# Patient Record
Sex: Female | Born: 1980 | Race: White | Hispanic: No | Marital: Married | Smoking: Never smoker
Health system: Southern US, Community
[De-identification: ages and names within clinical notes are randomized; demographics above are authoritative.]

## PROBLEM LIST (undated history)

## (undated) DIAGNOSIS — K5010 Crohn's disease of large intestine without complications: Secondary | ICD-10-CM

## (undated) HISTORY — PX: COLON SURGERY: SHX602

## (undated) HISTORY — PX: KNEE ARTHROCENTESIS: SUR44

---

## 1999-07-04 ENCOUNTER — Other Ambulatory Visit: Admission: RE | Admit: 1999-07-04 | Discharge: 1999-07-04 | Payer: Self-pay | Admitting: Obstetrics and Gynecology

## 1999-10-16 ENCOUNTER — Other Ambulatory Visit: Admission: RE | Admit: 1999-10-16 | Discharge: 1999-10-16 | Payer: Self-pay | Admitting: Obstetrics and Gynecology

## 2000-07-16 ENCOUNTER — Other Ambulatory Visit: Admission: RE | Admit: 2000-07-16 | Discharge: 2000-07-16 | Payer: Self-pay | Admitting: Obstetrics and Gynecology

## 2001-06-02 ENCOUNTER — Other Ambulatory Visit: Admission: RE | Admit: 2001-06-02 | Discharge: 2001-06-02 | Payer: Self-pay | Admitting: Obstetrics and Gynecology

## 2005-03-26 ENCOUNTER — Emergency Department: Payer: Self-pay | Admitting: Emergency Medicine

## 2006-01-06 ENCOUNTER — Ambulatory Visit: Payer: Self-pay | Admitting: Internal Medicine

## 2006-06-05 ENCOUNTER — Ambulatory Visit: Payer: Self-pay | Admitting: Internal Medicine

## 2006-08-26 ENCOUNTER — Ambulatory Visit: Payer: Self-pay | Admitting: Internal Medicine

## 2006-08-27 ENCOUNTER — Ambulatory Visit: Payer: Self-pay | Admitting: Internal Medicine

## 2006-08-27 ENCOUNTER — Inpatient Hospital Stay (HOSPITAL_COMMUNITY): Admission: EM | Admit: 2006-08-27 | Discharge: 2006-08-29 | Payer: Self-pay | Admitting: Emergency Medicine

## 2006-09-12 ENCOUNTER — Emergency Department (HOSPITAL_COMMUNITY): Admission: EM | Admit: 2006-09-12 | Discharge: 2006-09-12 | Payer: Self-pay | Admitting: Emergency Medicine

## 2006-10-09 ENCOUNTER — Ambulatory Visit: Payer: Self-pay | Admitting: Internal Medicine

## 2008-01-22 IMAGING — CR DG CHEST 2V
2 series · 2 of 2 positions shown · non-contrast
Comparison: 09/12/06.

CLINICAL DATA: 26-year-old, dyspnea.  
 CHEST - 2 VIEW:

[view not recorded (1 of 2)]
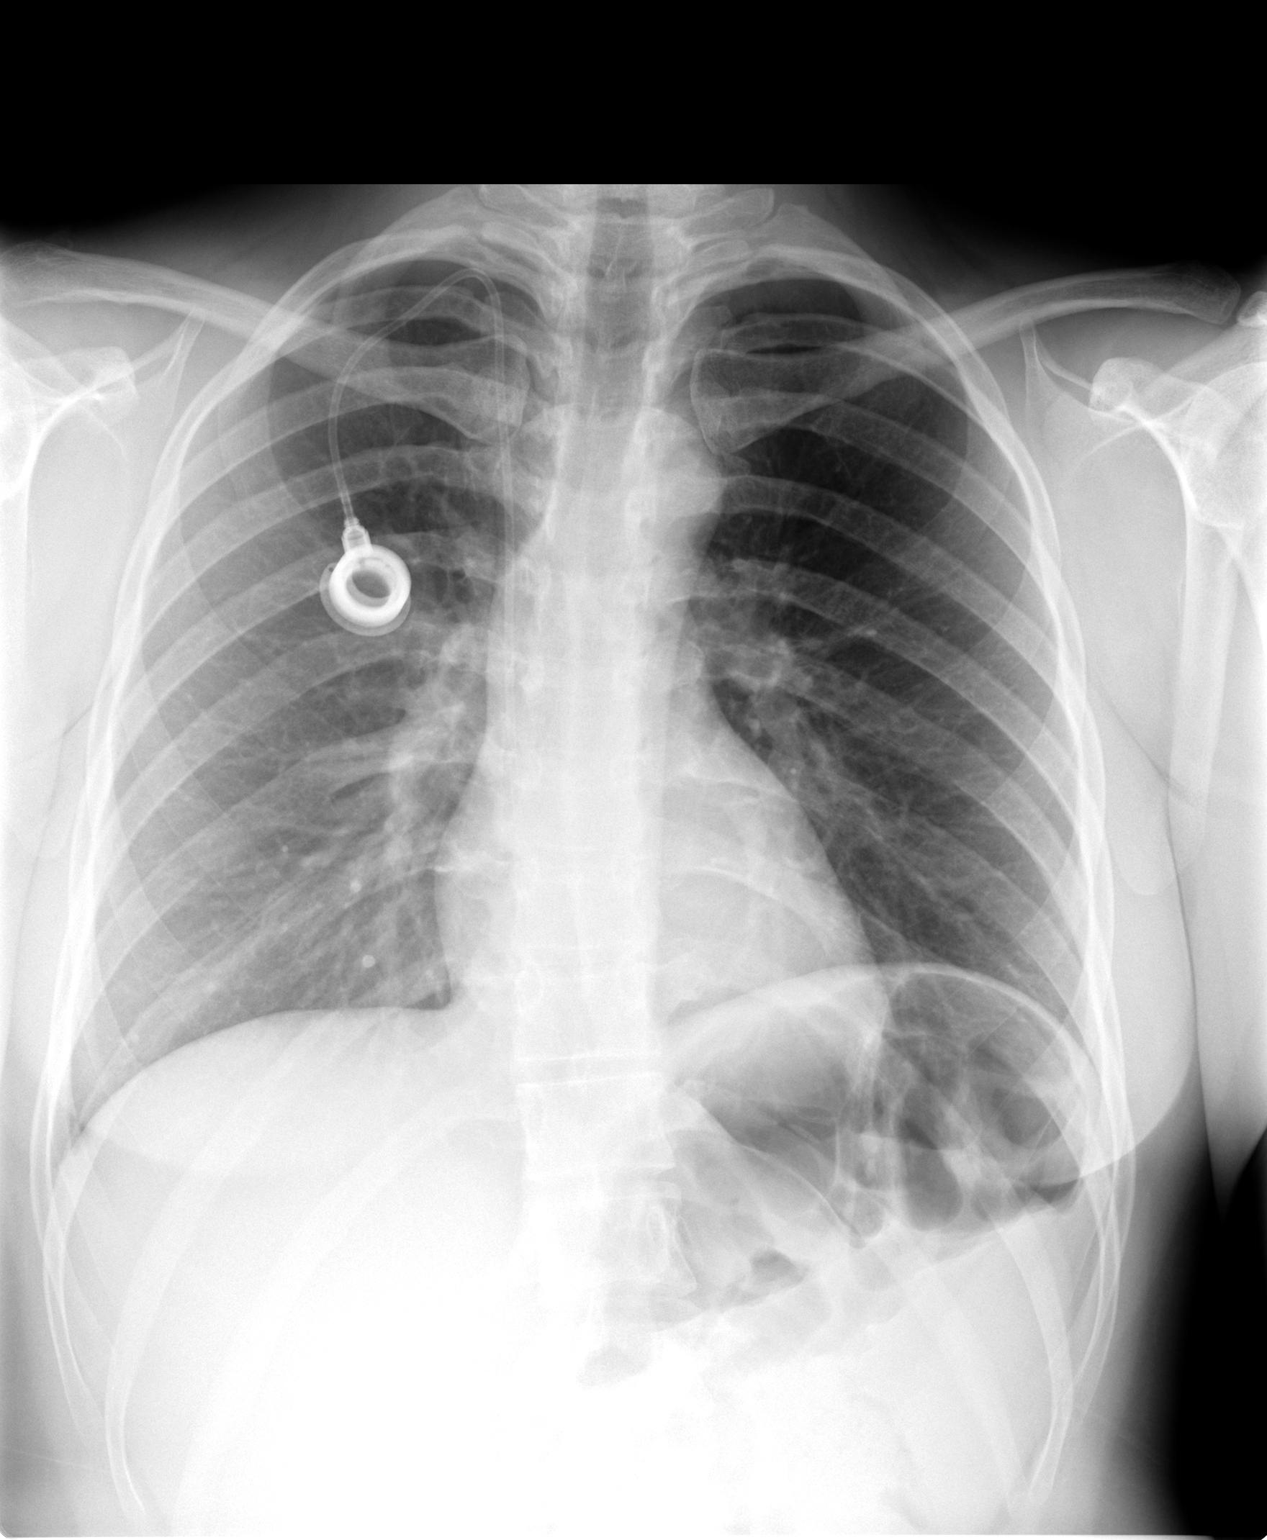

[view not recorded (2 of 2)]
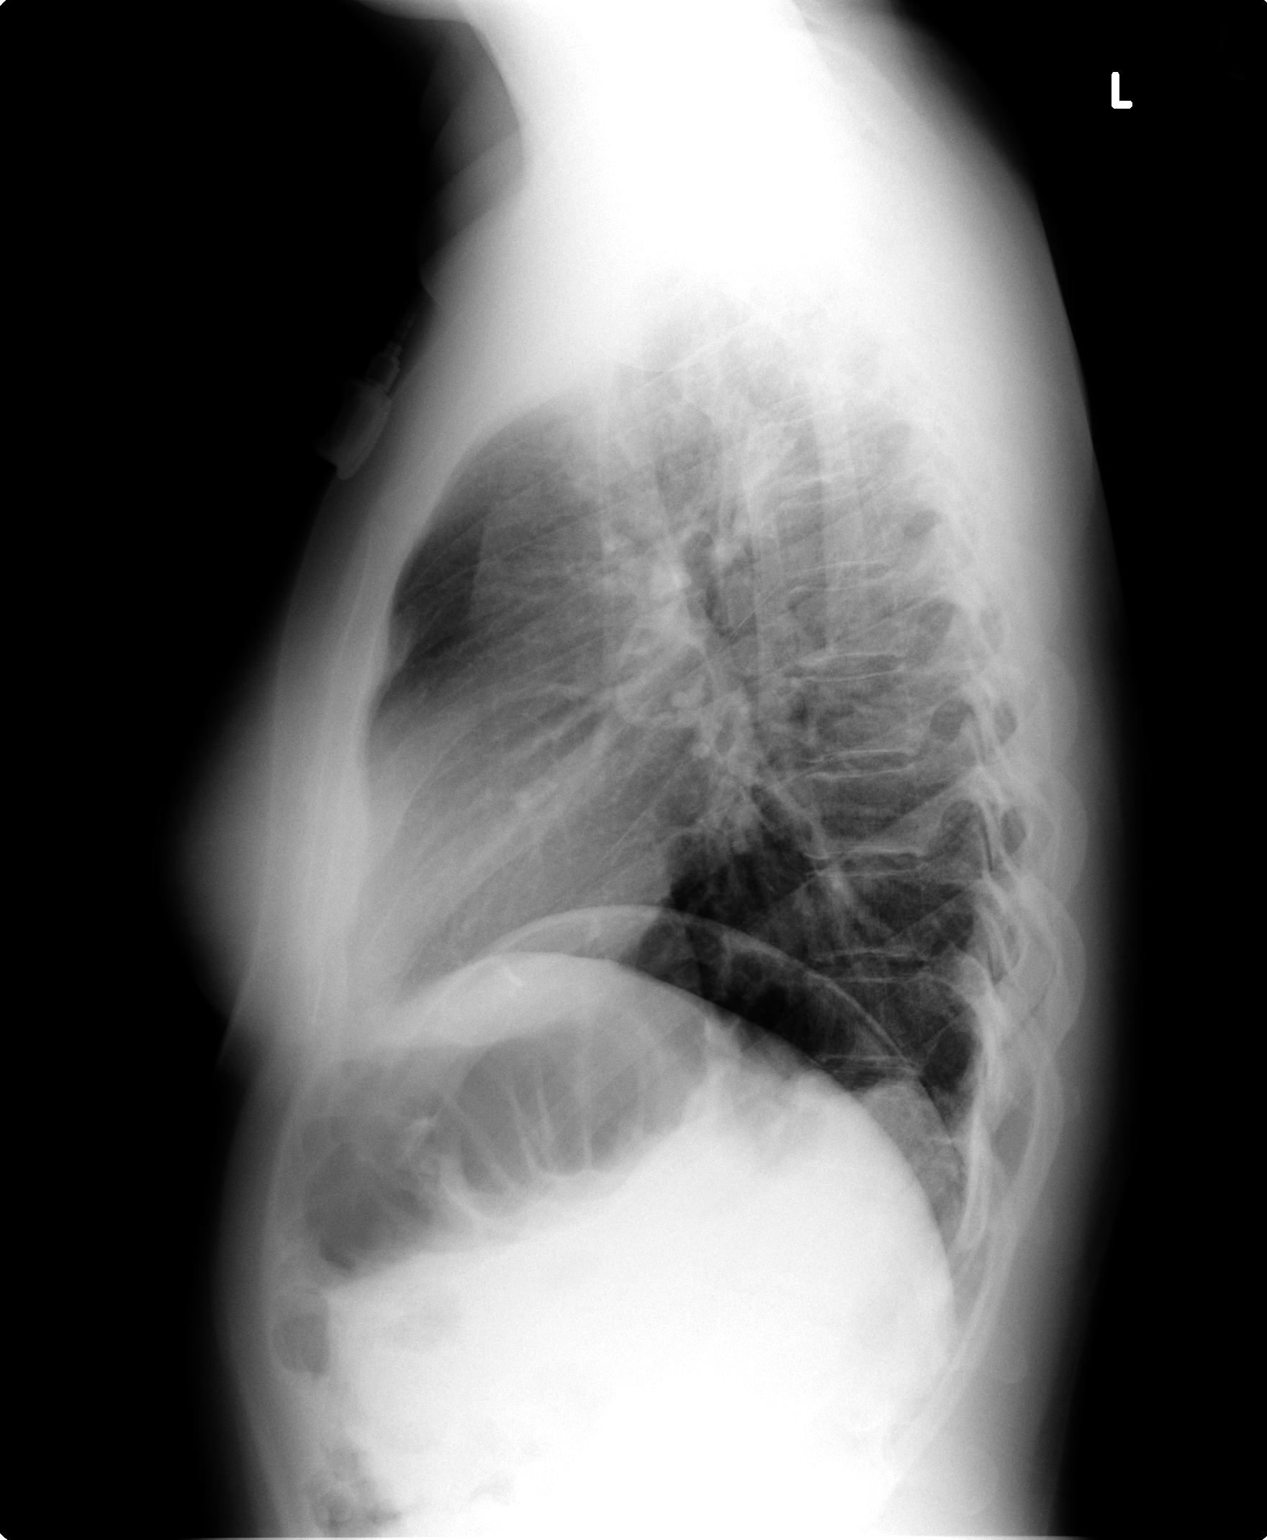

[2 of 2 positions shown; findings below may reference images not displayed]

Findings;  The port-a-cath is stable.  Cardiac silhouette, mediastinal, and hilar contours are within normal limits.  Lungs are clear of acute process.  Bony structures are intact.
IMPRESSION: No acute cardiopulmonary findings.

## 2008-07-10 ENCOUNTER — Ambulatory Visit: Payer: Self-pay | Admitting: Family Medicine

## 2008-10-11 ENCOUNTER — Ambulatory Visit: Payer: Self-pay | Admitting: Occupational Medicine

## 2008-10-11 DIAGNOSIS — M65849 Other synovitis and tenosynovitis, unspecified hand: Secondary | ICD-10-CM

## 2008-10-11 DIAGNOSIS — M65839 Other synovitis and tenosynovitis, unspecified forearm: Secondary | ICD-10-CM | POA: Insufficient documentation

## 2010-06-05 NOTE — Discharge Summary (Signed)
Sandra Hanson, Sandra Hanson                ACCOUNT NO.:  [id number]   MEDICAL RECORD NO.:  [id number]          PATIENT TYPE:  INP   LOCATION:  1529                         FACILITY:  [facility]   PHYSICIAN:  Casimiro Needle B. Sherene Sires, MD, FCCPDATE OF BIRTH:  29-Apr-1978   DATE OF ADMISSION:  08/26/2006  DATE OF DISCHARGE:  08/29/2006                         DISCHARGE SUMMARY - REFERRING   DISCHARGE DIAGNOSES:  1. Vocal cord dysfunction, upper airway irritation following presumed      aspiration.  2. Chronic pain.  3. Crohn's disease.  4. Open perirectal wound.   HISTORY OF PRESENT ILLNESS:  Sandra Hanson is a 30 year old white  female who presented to the emergency department at [facility]  in acute respiratory distress.  She has noted that she had aspirated an  almond the prior day while talking on the phone.  She had no cough prior  to this but had incessant upper airway barking quality cough that  continued to plague her.  In the emergency department, she was found to  be anxious to the point of almost being hysterical.  For that reason,  she was admitted to [facility] for further evaluation and  treatment.   LABORATORY DATA:  Sodium 140, potassium 4.2, chloride 106, CO2 27,  glucose 102, BUN 14, creatinine 0.81.  Calcium 9.6.  Protein 7.6.  AST  27, ALT 25.  Alkaline phosphatase 77, total bilirubin 0.5.  The wbc is  7.8, hemoglobin is 14.7, hematocrit 41.7, platelets 253.   Chest x-ray shows stable support apparatus, no changes.  No acute  distress.  She has a right IJ Port-A-Cath in place.   PROCEDURE:  On August 27, 2006, she underwent a fiberoptic bronchoscopy  per Dr. Sandrea Hughs.  After being appropriate sedated with a total of  10 mg Versed and 100 mg of IV Demerol, her airway was inspected x2.  Did  not demonstrate any foreign bodies.  Additional lidocaine was used to  anesthetize her entire bronchotracheal tree.  The mid trachea was  erythematous and slightly  friable mucosa.  The distal trachea was normal  as was the carina and all airways and subsegmental levels were inspected  twice for any evidence of abnormality and was found to be clear.  No  further interventions were required.   HOSPITAL COURSE BY DISCHARGE DIAGNOSIS:  DISCHARGE DIAGNOSIS #1 -  ASPIRATION OF SUSPECTED FOREIGN BODY WITH FOCAL CORD DYSFUNCTION, UPPER  AIRWAY IRRITATION:  After being admitted to the hospital, she  subsequently was transferred to the intensive care unit for continuous  barking cough and hysteria.  After being evaluated in the intensive care  unit, she was taken to bronchoscopy suite and underwent a fiberoptic  bronchoscopy with Dr. Sherene Sires with negative findings.  We intensified her  care, transferred her to the floor.  She reached maximum hospital  benefit by August 29, 2006, and was ready for discharge home.  Note, we  will continue her on her proton pump inhibitor for a total of 30 days to  decrease irritation of acid reflux and already irritated airway.  She  will  follow up with Dr. Sandrea Hughs in one week for further evaluation  and treatment.   DISCHARGE DIAGNOSIS #2 -  CHRONIC PAIN:  She was continued on her  current medications to consist of Fentanyl patch and other medications  as noted in her discharge medications.  This has been an ongoing problem  with her.   PROBLEM #3 -  CROHN'S DISEASE:  This was a moot point at this time.   PROBLEM #4 -  OPEN WOUND FROM PERIRECTAL AREA WHERE SHE HAS HAD  MULTIPLE, I.E., NINE OPERATIONS FOR PERIRECTAL ABSCESS:  She will  continue under the care of [place] surgeons for  this.   DISCHARGE DIAGNOSIS #5 -  ANXIETY:  This is addressed and continuation  of her Xanax.   DISCHARGE MEDICATIONS:  1. Cymbalta 60 mg daily.  2. Fentanyl 50 mcg patch change q.72h.  3. Flector 1.3% patch to affected area, change b.i.d.  4. Humira 40 mcg subcu every two weeks.  5. Oxycodone 1 tablet three  times a day as needed.  6. Phenergan 25 mg one tablet q.6h. as needed.  7. Requip 0.5 mg three tablets at bedtime.  8. Xanax 1 mg one tablet q.8h. as needed.  9. Zofran 8 mg q.12h. as needed.  10.Flexeril 10 mg q.6h. as needed.  11.Robitussin DM 1 teaspoon q.12h. as needed for cough.  12.Protonix 40 mg one tablet daily until gone.  13.Prednisone on a taper 40 mg for three days, 30 mg for three days,      20 mg for three days, 10 mg for three days and then stop.   FOLLOW UP APPOINTMENTS:  Dr. Sandrea Hughs on September 10, 2006, at 11:45  a.m.   DIET:  Heart healthy diet.   She has been instructed should she have any acute respiratory distress,  to seek attention at the nearest emergency room or to follow up with [place]  if it is less stringent.      Devra Dopp, MSN, ACNP      Charlaine Dalton. Sherene Sires, MD, [facility]  Electronically Signed    SM/MEDQ  D:  08/29/2006  T:  08/29/2006  Job:  [id number]   cc:   Bethann Punches  Fax: [phone]

## 2010-06-05 NOTE — Op Note (Signed)
Sandra Hanson, Sandra Hanson                ACCOUNT NO.:  [id number]   MEDICAL RECORD NO.:  [id number]          PATIENT TYPE:  INP   LOCATION:  1224                         FACILITY:  [facility]   PHYSICIAN:  Charlaine Dalton. Sherene Sires, MD, FCCPDATE OF BIRTH:  10/21/1980   DATE OF PROCEDURE:  08/27/2006  DATE OF DISCHARGE:                               OPERATIVE REPORT   PROCEDURE:  Fiberoptic bronchoscopy, diagnostic.   HISTORY:  This is a 26-year white female who feels she aspirated an  almond yesterday while talking on the phone.  She had no cough prior to  this but has been having incessant an upper airway barking quality  cough and is convinced that there is something stuck in her throat.  The cough has not been controllable with inhaled lidocaine, albuterol,  and analgesics to date.  The cough was so severe last night, she was  moved to the ICU to manage possible intubation.   The patient was extremely anxious prior to the procedure but did agree  to the procedure after discussion of full discussion of risks, benefits,  and alternatives, and received a total of 10 mg of IV Versed and 100 mg  of IV Demerol.   She was premedicated with 1% lidocaine.   Using a standard flexible fiberoptic bronchoscope, the right naris was  nares was easily cannulated, with good visualization of the entire  oropharynx and larynx.  The cords moved normally, and there were no  apparent upper airway lesions.   Using additional 1% lidocaine, the entire tracheobronchial tree was  explored, with the following findings.  The mid-trachea was  erythematous, with slightly friable mucosa.  The distal trachea was  normal, as was the carina, and all the airways to the subsegmental level  were inspected twice for any evidence of abnormality including a foreign  body.  All the airways opened widely, and there were no mucosal  abnormalities beyond the mid-trachea.   IMPRESSION:  Inflamed mid-trachea mucosa secondary to incessant  coughing.  I believe that although she very well may have aspirated an  almond at one point, it has been coughed up, and at this point the cough  is actually part of the problem and that she is traumatizing the trachea  because she is coughing so violently.   I have explained this to her mother, an R.N., in detail and recommended  the use of the flutter valve as well as around-the-clock  bronchodilators.  Will also use Solu-Medrol to try to control  inflammation.   Impression is incessant coughing, without any evidence of foreign body.   RECOMMENDATIONS:  Return to the step-down unit for medical therapy.      Charlaine Dalton. Sherene Sires, MD, [facility]  Electronically Signed     MBW/MEDQ  D:  08/27/2006  T:  08/27/2006  Job:  [id number]

## 2010-06-05 NOTE — Assessment & Plan Note (Signed)
[facility]                             PULMONARY OFFICE NOTE   DEARIA, WILMOUTH                       MRN:          [id number]  DATE:10/09/2006                            DOB:          1980-02-01    CHIEF COMPLAINT:  Cough.   HISTORY:  This is an exceptionally challenging and complicated 30-year-  old white female whom I evaluated for acute onset cough after possibly  choking on an almond one day prior to admission on August 5. This lead  eventually to a bronchoscopy that showed nothing but irritation of the  trachea proximally, with all the peripheral airways perfectly normal.   I presumed she had a cyclic cough and noted that she did have a history  of Crohn's disease and chronic pain syndrome and contemplated that  whether or not she had a functional aspect of the cough and/or airway  involvement with Crohn's, although I thought the latter very unlikely.   She was supposed to come back for follow up within 2 weeks but never  returned to the clinic. Her husband tells me today that she was  completely better for 2 weeks but now has been getting her care from  multiple centers, including within [facility] both in the  emergency room and by the ENT Department there (she identified Dr.  Deland Pretty). She says that different doctors even within the same  medical center have been disagreeing over what her problem is. One  doctor told her she had vocal cord dysfunction, one doctor said she had  pneumonia but she could not identify who told her what.   She comes back having apparently tapered herself off of all narcotics  (note she had previous chronic pain syndrome) and also stopping her  antidepressants (she had previously been on Cymbalta). (I could not  actually tell under who's instructions she had been tapering or  adjusting her medications). In any case she and her husband are both  extremely frustrated with her care and with her  course and concerned  that no one has addressed the cough to their satisfaction (although they  both acknowledge that she was 100% better for 2 weeks after we last saw  her in August).   Presently she reports she is taking Protonix twice daily but did not  initially tell me that she was taking it the way that we had recommended  (30 to 60 minutes before meals twice daily). She is also taking ,  Requip, B12, Humira injections, and multivitamins.   She states she can not take prednisone for long periods of time because  she has glaucoma (although she is not under the care of an eye doctor or  on any glaucoma medicines).   ALLERGIES:  INCLUDE SULFA, REGLAN, MORPHINE AND DROPERIDOL.   She told me the best medicine that seemed to help her cough was the use  of Haldol but says she can not stay on that long term.   PHYSICAL EXAMINATION:  She is an ambulatory white female with somewhat a  belle indifference affect and attitude in that  her husband seems more  concerned than she is about her present problems. She is afebrile,  stable vital signs.  HEENT: Unremarkable. Oropharynx clear.  NECK: Supple without cervical adenopathy or tenderness. Trachea is  midline. No thyromegaly.  LUNG FIELDS: Perfectly clear bilaterally to auscultation and percussion  with no cough listened on inspiratory or expiratory maneuvers.  There is a regular rate and rhythm without murmur, rub, or gallop.  ABDOMEN: Soft, benign.  EXTREMITIES: Warm without calf tenderness, cyanosis, clubbing, or edema.   Chest x-ray is normal   IMPRESSION:  Recurrent upper airway cough that previously caused  inflammation in the proximal trachea only and occurred after she thought  she had choked an almond. She has been diagnosed with vocal cord  dysfunction at [facility] by one doctor but not by the other indicating  that she probably has intermittent vocal cord dysfunction as well as  functional reflux (she coughs so violently I  am sure she is refluxing as  well as causing secondary inflammation and trauma to the upper airway.   I am not really sure I can help this patient especially as frustrated as  she and her husband appear to be and I do not have a definitive  diagnosis but do not believe she has a pulmonary problem.   I did recommend the following specifics:  1. First I reviewed the optimal use of Protonix with her in writing      and also advised her to avoid all mint and menthol lozenges instead      use sugarless candy or ice to suppress the urge to clear her      throat.  2. Prednisone 10 mg tablets, #14, to be taken over 6 days.  3. I strongly encouraged the use of flutter valve and explained why it      was necessary for her not to cough violently (because it      traumatizes the airway when she does).  4. For cough use Mucinex DM 2 every 12 hours and if breakthrough cough      use Haldol in combination with tramadol every 4 hours just to      control the cyclical nature of the cough. If this is not effective      I had told her I would consider admitting her to the hospital.   The patient seemed frustrated that I did not have time to hear about all  the workup she had been through and the opinions of all the different  doctors that she has seen. I explained to the patient and her husband  that I would be happy to review records if made available to me, but  that how she actually feels is more important to me than what the  various doctors told her since she can only give me secondhand what  their impression was, and then until I can verify the objective findings  there is  no point in her trying to relate everything she has been  through.   However, when I reviewed the record I found out that she had actually  missed her follow up with me in August and I suspect we will have the  same problem here, namely that she will not return on a regular enough  basis to develop rapport and communication  to be able to control this  problem. I am very concerned about the anxiety and depression component  of the problem especially since she tapered Cymbalta off (without  being  able to tell me actually who was helping her taper if off). I will  therefore defer further workup to Dr. Bethann Punches in [place]. He  certainly has pulmonary and ENT physicians available there that can  coordinate her care there along with her gastroenterologist.   Add: I called the patient after the visit to let her know there is no  Deland Pretty ENT at [facility] and to ask her where she received her GI  care. It turns out it's in [facility], making a total of 4 diffent  medical systems trying to help her. I informed her this was not only  likely to be extremely fragmented, it also is likely to lead to more  frustration on her and her husband's part and since she's happy with her  GI care it would make more sense to let he be seen by [facility]NT  and Pulmonary as well if Dr Hyacinth Meeker agrees.     Charlaine Dalton. Sherene Sires, MD, [facility]  Electronically Signed    MBW/MedQ  DD: 10/09/2006  DT: 10/09/2006  Job #: [id number]

## 2010-11-02 LAB — BASIC METABOLIC PANEL
CO2: 26
Calcium: 8.9
Chloride: 105
GFR calc Af Amer: >60
Glucose, Bld: 91
Potassium: 4.1
Sodium: 139

## 2010-11-02 LAB — URINALYSIS, ROUTINE W REFLEX MICROSCOPIC
Glucose, UA: NEGATIVE
Hgb urine dipstick: NEGATIVE
Ketones, ur: NEGATIVE
Protein, ur: NEGATIVE
Urobilinogen, UA: 0.2

## 2010-11-02 LAB — DIFFERENTIAL
Basophils Absolute: 0
Basophils Relative: 0
Eosinophils Relative: 1
Monocytes Absolute: 0.4
Monocytes Relative: 5
Neutro Abs: 6.3

## 2010-11-02 LAB — HEPATIC FUNCTION PANEL
ALT: 64 — ABNORMAL HIGH
AST: 36
Bilirubin, Direct: 0.1
Indirect Bilirubin: 0.6
Total Bilirubin: 0.7

## 2010-11-02 LAB — CBC
HCT: 39.3
Hemoglobin: 13.6
MCHC: 34.6
RBC: 4.32
RDW: 13.8

## 2010-11-05 LAB — COMPREHENSIVE METABOLIC PANEL
Albumin: 4.1
BUN: 14
Calcium: 9.6
Creatinine, Ser: 0.81
GFR calc Af Amer: >60
Total Protein: 7.6

## 2010-11-05 LAB — DIFFERENTIAL
Basophils Absolute: 0
Lymphocytes Relative: 19
Lymphs Abs: 1.5
Monocytes Absolute: 0.4
Monocytes Relative: 5
Neutro Abs: 5.8

## 2010-11-05 LAB — CBC
HCT: 41.7
MCV: 88.4
Platelets: 253
RDW: 12.4

## 2010-12-20 DIAGNOSIS — I341 Nonrheumatic mitral (valve) prolapse: Secondary | ICD-10-CM | POA: Insufficient documentation

## 2011-06-20 DIAGNOSIS — K5090 Crohn's disease, unspecified, without complications: Secondary | ICD-10-CM | POA: Insufficient documentation

## 2013-06-03 ENCOUNTER — Ambulatory Visit: Payer: Self-pay | Admitting: Internal Medicine

## 2014-12-20 DIAGNOSIS — E538 Deficiency of other specified B group vitamins: Secondary | ICD-10-CM | POA: Insufficient documentation

## 2018-10-12 ENCOUNTER — Encounter: Payer: Self-pay | Admitting: Emergency Medicine

## 2018-10-12 ENCOUNTER — Other Ambulatory Visit: Payer: Self-pay

## 2018-10-12 ENCOUNTER — Emergency Department (INDEPENDENT_AMBULATORY_CARE_PROVIDER_SITE_OTHER): Payer: 59

## 2018-10-12 ENCOUNTER — Emergency Department (INDEPENDENT_AMBULATORY_CARE_PROVIDER_SITE_OTHER)
Admission: EM | Admit: 2018-10-12 | Discharge: 2018-10-12 | Disposition: A | Discharge status: Home / Self Care | Payer: 59 | Source: Home / Self Care | Attending: Family Medicine | Admitting: Family Medicine

## 2018-10-12 DIAGNOSIS — M62838 Other muscle spasm: Secondary | ICD-10-CM

## 2018-10-12 DIAGNOSIS — M542 Cervicalgia: Secondary | ICD-10-CM

## 2018-10-12 HISTORY — DX: Crohn's disease of large intestine without complications: K50.10

## 2018-10-12 MED ORDER — KETOROLAC TROMETHAMINE 60 MG/2ML IM SOLN
60.0000 mg | Freq: Once | INTRAMUSCULAR | Status: AC
  Administered 2018-10-12: 60 mg via INTRAMUSCULAR

## 2018-10-12 MED ORDER — PREDNISONE 20 MG PO TABS: ORAL_TABLET | ORAL | 0 refills | Status: DC

## 2018-10-12 MED ORDER — CYCLOBENZAPRINE HCL 10 MG PO TABS: 10.0000 mg | ORAL_TABLET | Freq: Three times a day (TID) | ORAL | 0 refills | Status: DC | PRN

## 2018-10-12 MED ORDER — METHYLPREDNISOLONE SODIUM SUCC 125 MG IJ SOLR
80.0000 mg | Freq: Once | INTRAMUSCULAR | Status: AC
  Administered 2018-10-12: 80 mg via INTRAMUSCULAR

## 2018-10-12 MED ORDER — CERVICAL COLLAR ADJUSTABLE MISC: 0 refills | Status: DC

## 2018-10-12 NOTE — ED Provider Notes (Signed)
[facility]    CSN: [id number] Arrival date & time: 10/12/18  1511      History   Chief Complaint Chief Complaint  Patient presents with  . Neck Pain    HPI Sandra Hanson is a 38 y.o. female.   Three days ago patient awoke with aching in her left neck.  The area has become more painful, extending almost to her left shoulder.  The pain is worse with any neck movement.  She recalls no injury to her neck.  The history is provided by the patient.    Past Medical History:  Diagnosis Date  . Crohn's colitis [facility])     Patient Active Problem List   Diagnosis Date Noted  . TENDINITIS, WRIST 10/11/2008         Home Medications    Prior to Admission medications   Medication Sig Start Date End Date Taking? Authorizing Provider  Adalimumab (HUMIRA) 40 MG/0.4ML PSKT Inject into the skin.   Yes [provider]  cyclobenzaprine (FLEXERIL) 10 MG tablet Take 1 tablet (10 mg total) by mouth 3 (three) times daily as needed for muscle spasms. 10/12/18   Lattie Haw, MD  Elastic Bandages & Supports (CERVICAL COLLAR ADJUSTABLE) MISC Wear daily as directed 10/12/18   Lattie Haw, MD  predniSONE (DELTASONE) 20 MG tablet Take one tab by mouth twice daily for 4 days, then one daily for 3 days. Take with food. 10/12/18   Lattie Haw, MD    Family History Family History  Problem Relation Age of Onset  . Hypertension Mother   . Migraines Mother   . Fibromyalgia Mother   . Wilson's disease Father   . Cancer Father     Social History Social History   Tobacco Use  . Smoking status: Never Smoker  . Smokeless tobacco: Never Used  Substance Use Topics  . Alcohol use: Yes  . Drug use: Not Currently     Allergies   Droperidol, Metoclopramide hcl, Morphine, and Sulfonamide derivatives   Review of Systems Review of Systems  Constitutional: Negative.   HENT: Negative.   Eyes: Negative.   Respiratory: Negative.   Cardiovascular: Negative.    Gastrointestinal: Negative.   Genitourinary: Negative.   Musculoskeletal: Positive for neck pain and neck stiffness.  Skin: Negative.   Neurological: Negative for numbness and headaches.     Physical Exam Triage Vital Signs ED Triage Vitals  Enc Vitals Group     BP 10/12/18 1636 139/90     Pulse Rate 10/12/18 1636 85     Resp --      Temp 10/12/18 1636 98.7 F (37.1 C)     Temp Source 10/12/18 1636 Oral     SpO2 10/12/18 1636 97 %     Weight 10/12/18 1638 185 lb (83.9 kg)     Height 10/12/18 1638 5\' 5"  (1.651 m)     Head Circumference --      Peak Flow --      Pain Score 10/12/18 1637 7     Pain Loc --      Pain Edu? --      Excl. in GC? --    No data found.  Updated Vital Signs BP 139/90 (BP Location: Right Arm)   Pulse 85   Temp 98.7 F (37.1 C) (Oral)   Ht 5\' 5"  (1.651 m)   Wt 83.9 kg   SpO2 97%   BMI 30.79 kg/m   Visual Acuity Right Eye Distance:  Left Eye Distance:   Bilateral Distance:    Right Eye Near:   Left Eye Near:    Bilateral Near:     Physical Exam Vitals signs and nursing note reviewed.  Constitutional:      General: She is not in acute distress. HENT:     Head: Atraumatic.     Right Ear: Tympanic membrane normal.     Left Ear: Tympanic membrane normal.     Nose: Nose normal.     Mouth/Throat:     Pharynx: Oropharynx is clear.  Eyes:     Conjunctiva/sclera: Conjunctivae normal.     Pupils: Pupils are equal, round, and reactive to light.  Neck:     Musculoskeletal: Neck supple. Decreased range of motion. Pain with movement and muscular tenderness present. No edema, erythema, torticollis or spinous process tenderness.      Comments: Patient has distinct tenderness to palpation over her left and posterior neck as noted on diagram.  She has difficulty rotating and flexing her neck to the left.  Distal neurovascular function is intact.  Cardiovascular:     Rate and Rhythm: Normal rate.     Heart sounds: Normal heart sounds.   Pulmonary:     Breath sounds: Normal breath sounds.  Musculoskeletal:     Right lower leg: No edema.     Left lower leg: No edema.  Lymphadenopathy:     Cervical: No cervical adenopathy.  Skin:    General: Skin is warm and dry.     Findings: No rash.  Neurological:     Mental Status: She is alert.      UC Treatments / Results  Labs (all labs ordered are listed, but only abnormal results are displayed) Labs Reviewed - No data to display  EKG   Radiology Dg Cervical Spine Complete  Result Date: 10/12/2018 CLINICAL DATA:  Left-sided neck pain for 3 days.  No known injury. EXAM: CERVICAL SPINE - COMPLETE 4+ VIEW COMPARISON:  None. FINDINGS: Straightening of the cervical lordosis, which may be positional or secondary to muscular strain. Intervertebral disc spaces are preserved. Vertebral body heights are maintained. No fractures. Oblique views reveal patent bony neural foramina. Dens and lateral masses are aligned. There is mild facet and uncovertebral arthropathy within the mid cervical spine. IMPRESSION: 1. No acute fracture or static subluxation, cervical spine. 2. Mild facet and uncovertebral degenerative change. 3. Straightening of the cervical lordosis, which may be positional or secondary to muscular strain. Electronically Signed   By: Duanne Guess M.D.   On: 10/12/2018 18:08    Procedures Procedures (including critical care time)  Medications Ordered in UC Medications  methylPREDNISolone sodium succinate (SOLU-MEDROL) 125 mg/2 mL injection 80 mg (has no administration in time range)  ketorolac (TORADOL) injection 60 mg (60 mg Intramuscular Given 10/12/18 1746)    Initial Impression / Assessment and Plan / UC Course  I have reviewed the triage vital signs and the nursing notes.  Pertinent labs & imaging results that were available during my care of the patient were reviewed by me and considered in my medical decision making (see chart for details).    C spine X-ray:  note mild facet degenerative changes Administered Toradol 60mg  IM. Administered Solumedrol 80mg  IM, then begin prednisone burst/taper. Rx for Flexeril. Rx for cervical collar. Followup with Dr. Rodney Langton or Dr. Clementeen Graham ([facility]) if not improving about one week.    Final Clinical Impressions(s) / UC Diagnoses   Final diagnoses:  Muscle  spasms of neck     Discharge Instructions     Begin prednisone Tuesday 10/13/18. Apply ice pack for 20 to 30 minutes, 3 to 4 times daily  Continue until pain and swelling decrease.  Wear cervical collar. Begin range of motion and stretching exercises as tolerated.    ED Prescriptions    Medication Sig Dispense Auth. Provider   predniSONE (DELTASONE) 20 MG tablet Take one tab by mouth twice daily for 4 days, then one daily for 3 days. Take with food. 11 tablet Kandra Nicolas, MD   cyclobenzaprine (FLEXERIL) 10 MG tablet Take 1 tablet (10 mg total) by mouth 3 (three) times daily as needed for muscle spasms. 20 tablet Kandra Nicolas, MD   Elastic Bandages & Supports (CERVICAL COLLAR ADJUSTABLE) MISC Wear daily as directed 1 each Assunta Found Ishmael Holter, MD        Kandra Nicolas, MD 10/14/18 [phone]

## 2018-10-12 NOTE — Discharge Instructions (Addendum)
Begin prednisone Tuesday 10/13/18. Apply ice pack for 20 to 30 minutes, 3 to 4 times daily  Continue until pain and swelling decrease.  Wear cervical collar. Begin range of motion and stretching exercises as tolerated.

## 2018-10-12 NOTE — ED Triage Notes (Signed)
Neck pain x 3 days, Left side, decreased ROM, heat helps, 800mg s Ibuprofen

## 2019-02-10 ENCOUNTER — Encounter: Payer: Self-pay | Admitting: Sports Medicine

## 2019-02-10 ENCOUNTER — Other Ambulatory Visit: Payer: Self-pay

## 2019-02-10 ENCOUNTER — Ambulatory Visit (INDEPENDENT_AMBULATORY_CARE_PROVIDER_SITE_OTHER): Payer: 59 | Admitting: Sports Medicine

## 2019-02-10 DIAGNOSIS — M542 Cervicalgia: Secondary | ICD-10-CM

## 2019-02-10 DIAGNOSIS — G8929 Other chronic pain: Secondary | ICD-10-CM | POA: Diagnosis not present

## 2019-02-10 DIAGNOSIS — M5030 Other cervical disc degeneration, unspecified cervical region: Secondary | ICD-10-CM | POA: Insufficient documentation

## 2019-02-10 MED ORDER — DIAZEPAM 5 MG PO TABS: ORAL_TABLET | ORAL | 0 refills | Status: DC

## 2019-02-10 MED ORDER — PREDNISONE 10 MG (48) PO TBPK: ORAL_TABLET | Freq: Every day | ORAL | 0 refills | Status: DC

## 2019-02-10 MED ORDER — KETOROLAC TROMETHAMINE 60 MG/2ML IM SOLN
60.0000 mg | Freq: Once | INTRAMUSCULAR | Status: AC
  Administered 2019-02-10: 60 mg via INTRAMUSCULAR

## 2019-02-10 NOTE — Progress Notes (Signed)
    Procedures performed today:    None.  Independent interpretation of tests performed by another provider:   I personally reviewed her cervical spine x-rays, they simply show straightening of the normal lordosis consistent with spasm.  Impression and Recommendations:    Chronic neck pain Chronic right-sided neck pain with spasm. This has been present now for over 4 months on and off. This is in spite of several visits to urgent care and conservative measures. Toradol 60 mg intramuscular. At this point we are going to do a prolonged prednisone taper for 12 days, adding hydrocodone for pain, Valium as a muscle relaxer as Robaxin and Flexeril did not help much. X-ray was unrevealing so we are going to proceed with an MRI as well as aggressive formal physical therapy. Return to see me in 2 weeks.    ___________________________________________ Ihor Austin. Benjamin Stain, M.D., ABFM., CAQSM. Primary Care and Sports Medicine [facility]  Adjunct Instructor of Family Medicine  [employer]

## 2019-02-10 NOTE — Assessment & Plan Note (Signed)
Chronic right-sided neck pain with spasm. This has been present now for over 4 months on and off. This is in spite of several visits to urgent care and conservative measures. Toradol 60 mg intramuscular. At this point we are going to do a prolonged prednisone taper for 12 days, adding hydrocodone for pain, Valium as a muscle relaxer as Robaxin and Flexeril did not help much. X-ray was unrevealing so we are going to proceed with an MRI as well as aggressive formal physical therapy. Return to see me in 2 weeks.

## 2019-02-15 ENCOUNTER — Other Ambulatory Visit: Payer: Self-pay

## 2019-02-15 ENCOUNTER — Ambulatory Visit (INDEPENDENT_AMBULATORY_CARE_PROVIDER_SITE_OTHER): Payer: 59 | Admitting: Rehabilitative and Restorative Service Providers"

## 2019-02-15 DIAGNOSIS — R293 Abnormal posture: Secondary | ICD-10-CM | POA: Diagnosis not present

## 2019-02-15 DIAGNOSIS — M62838 Other muscle spasm: Secondary | ICD-10-CM | POA: Diagnosis not present

## 2019-02-15 DIAGNOSIS — M542 Cervicalgia: Secondary | ICD-10-CM | POA: Diagnosis not present

## 2019-02-15 NOTE — Patient Instructions (Signed)
Access Code: [email or web address]  URL: [email or web address]  Date: 02/15/2019  Prepared by: Margretta Ditty   Exercises Towel Roll Stretch - 10 reps - 1 sets - 2x daily - 7x weekly Supine Cervical Rotation AROM on Pillow - 10 reps - 1 sets - 2x daily - 7x weekly

## 2019-02-15 NOTE — Therapy (Signed)
[facility] [place] [address] [place], [place], [address] Phone: [phone]   Fax:  [phone]  Physical Therapy Evaluation  Patient Details  Name: Sandra Hanson MRN: [id number] Date of Birth: Dec 02, 1980 Referring Provider (PT): Monica Becton, MD   Encounter Date: 02/15/2019  PT End of Session - 02/15/19 1441    Visit Number  1    Number of Visits  12    Date for PT Re-Evaluation  04/01/19    PT Start Time  1350    PT Stop Time  1435    PT Time Calculation (min)  45 min       Past Medical History:  Diagnosis Date  . Crohn's colitis (HCC)       There were no vitals filed for this visit.   Subjective Assessment - 02/15/19 1355    Subjective  The patient awoke with a "kink" in her neck on 10/11/18 with worsening pain t/o the day.  She went to urgent care on 9/21 and received injection for pain.  Symptoms resolved after a couple of weeks.  She awoke 2 weeks ago and had another kink in her neck and her neck flared back up.  She did a phone visit through work and took steroids, robaxin, motrin and began with icy hot.  Symtpoms improved on the steroids but pain worsened when she tapered off.  She saw Dr. Karie Schwalbe and received another week of tordol and steroids.  She feels she is moving better today than she has in the past 2 weeks.    Patient Stated Goals  reduce pain; prevent these occurrences of neck pain    Currently in Pain?  Yes    Pain Score  6    moving her head increases pain significantly   Pain Location  Neck    Pain Orientation  Right    Pain Descriptors / Indicators  Stabbing;Spasm;Discomfort    Pain Type  Acute pain    Pain Radiating Towards  R side    Pain Onset  1 to 4 weeks ago    Pain Frequency  Constant    Aggravating Factors   varies in intensity    Pain Relieving Factors  lying down improves it         [facility] PT Assessment - 02/15/19 1404      Assessment   Medical Diagnosis  neck pain     Referring Provider (PT)  Monica Becton, MD    Onset Date/Surgical Date  --   2 weeks ago   Hand Dominance  Right      Precautions   Precautions  None      Restrictions   Weight Bearing Restrictions  No      Balance Screen   Has the patient fallen in the past 6 months  No    Has the patient had a decrease in activity level because of a fear of falling?   No    Is the patient reluctant to leave their home because of a fear of falling?   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     AROM Assessment Site  Cervical    Cervical Flexion  10    Cervical Extension  20    Cervical - Right Side Bend  10    Cervical - Left Side Bend  30    Cervical - Right Rotation  30    Cervical - Left Rotation  65      Strength   Overall Strength Comments  R UE/L UE; has 5/5 MMT for shoulder flexion and abduction, although any resistance leads to R sided neck pain      Palpation   Palpation comment  tender along C3, C4 facet joints, paraspinal musculature.  Joint mobiization limited for PA mobility mid to lower cervical spine, lateral glides diminished mobility R>L with muscle guarding      Special Tests    Special Tests  Cervical    Cervical Tests  Dictraction      Distraction Test   Findngs  Positive    side  Right    Comment  mild reduction in symptoms                Objective measurements completed on  examination: See above findings.      [place] Adult PT Treatment/Exercise - 02/15/19 1404      Exercises   Exercises  Neck      Neck Exercises: Supine   Cervical Isometrics  Right lateral flexion;Right rotation;3 secs    Cervical Isometrics Limitations  performed contract/relax R lateral flexion + rotation    Neck Retraction  5 reps;3 secs    Neck Retraction Limitations  within tolerable ROM    Cervical Rotation  Right;Left;10 reps    Cervical Rotation Limitations  within tolerable ROM *more limited to R      Modalities   Modalities  Cryotherapy      Cryotherapy   Number Minutes Cryotherapy  6 Minutes    Cryotherapy Location  Cervical    Type of Cryotherapy  Ice massage      Manual Therapy   Manual Therapy  Joint mobilization;Soft tissue mobilization;Taping    Manual therapy comments  supine and sideling; taping to the R upper trap and levator to support cervical musculature    Joint Mobilization  SUPINE:  PA mobilization mid cervical spine grade I-II ; lateral glide grade II R>L within tolerance.  SIDELYING:  lateral glide R>L in L sidelying    Soft tissue mobilization  STM to paraspinal musculature             PT Education - 02/15/19 1440    Education Details  initiated HEP    Person(s) Educated  Patient    Methods  Explanation;Demonstration;Handout    Comprehension  Returned demonstration;Verbalized understanding          PT Long Term Goals - 02/15/19 1442      PT LONG TERM GOAL #1   Title  The patient will be able to return demo HEP for neck flexibility, postural strengthening and stability.    Time  6    Period  Weeks    Target Date  04/01/19      PT LONG  TERM GOAL #2   Title  The patient will report functional limitation < 30% per FOTO.    Baseline  66% limited    Time  6    Period  Weeks    Target Date  04/01/19      PT LONG TERM GOAL #3   Title  The patient will improve R cervical rotation to > or equal to 55 degrees.    Baseline  30 degrees.     Time  6    Period  Weeks    Target Date  04/01/19      PT LONG TERM GOAL #4   Title  The patient will improve R cervical sidebending to 25 degrees.    Baseline  10 degrees    Time  6    Period  Weeks    Target Date  04/01/19      PT LONG TERM GOAL #5   Title  The patient will report resting pain < or equal to 2/10 in R cervical spine.    Baseline  6/10    Time  6    Period  Weeks    Target Date  04/01/19             Plan - 02/15/19 1452    Clinical Impression Statement  The patient is a 39 year old female with second occurrence of cervical muscule spasms after a "kink" in her neck.  At this time, she presents with significant limitation in cervical ROM, muscle guarding, decreased joint mobility, and muscle spasm.  Patinet has pain limiting all ADLs/IADLs.  PT to address deficits to promote return to prior level of function and focus on strengthening postural muscles for prevention of ongoing neck issues.    Examination-Activity Limitations  Lift;Reach Overhead;Bed Mobility    Examination-Participation Restrictions  Driving    Stability/Clinical Decision Making  Stable/Uncomplicated    Clinical Decision Making  Low    Rehab Potential  Good    PT Frequency  2x / week    PT Duration  6 weeks    PT Treatment/Interventions  Patient/family education;Neuromuscular re-education;Therapeutic exercise;Therapeutic activities;Functional mobility training;ADLs/Self Care Home Management;Cryotherapy;Electrical Stimulation;Iontophoresis 4mg /ml Dexamethasone;Moist Heat;Traction;Ultrasound;Manual techniques;Taping;Dry needling;Joint Manipulations;Spinal Manipulations;Passive range of motion    PT Next Visit Plan  joint mobilization mid to upper cervical spine; thoracic spine assessment for PA mobility; HEP with thoracic extension, progress neck stabilization (prone on elbows or quadriped for anti-gravity strengthening).    PT Home Exercise Plan  Access Code:    Consulted and Agree with  Plan of Care  Patient       Patient will benefit from skilled therapeutic intervention in order to improve the following deficits and impairments:  Pain, Hypomobility, Impaired flexibility, Increased muscle spasms, Decreased range of motion, Decreased strength, Postural dysfunction  Visit Diagnosis: Cervicalgia  Other muscle spasm  Abnormal posture     Problem List Patient Active Problem List   Diagnosis Date Noted  . Chronic neck pain 02/10/2019  . TENDINITIS, WRIST 10/11/2008    Burnie Hank, PT 02/15/2019, 2:56 PM  [facility] [address] [place], [place], [place] Phone: [phone]   Fax:  [phone]  Name: KENNEDY BRINES MRN: Tamala Ser Date of Birth: 1981-01-16

## 2019-02-17 ENCOUNTER — Encounter: Payer: Self-pay | Admitting: Rehabilitative and Restorative Service Providers"

## 2019-02-17 ENCOUNTER — Ambulatory Visit (INDEPENDENT_AMBULATORY_CARE_PROVIDER_SITE_OTHER): Payer: 59 | Admitting: Rehabilitative and Restorative Service Providers"

## 2019-02-17 ENCOUNTER — Other Ambulatory Visit: Payer: Self-pay

## 2019-02-17 DIAGNOSIS — M542 Cervicalgia: Secondary | ICD-10-CM

## 2019-02-17 DIAGNOSIS — R293 Abnormal posture: Secondary | ICD-10-CM | POA: Diagnosis not present

## 2019-02-17 DIAGNOSIS — M62838 Other muscle spasm: Secondary | ICD-10-CM

## 2019-02-17 NOTE — Therapy (Signed)
[facility] [place] [address] [place], [place], [address] Phone: [phone]   Fax:  [phone]  Physical Therapy Treatment  Patient Details  Name: Sandra Hanson MRN: [id number] Date of Birth: May 07, 1980 Referring Provider (PT): Monica Becton, MD   Encounter Date: 02/17/2019  PT End of Session - 02/17/19 0930    Visit Number  2    Number of Visits  12    Date for PT Re-Evaluation  04/01/19    PT Start Time  0845    PT Stop Time  0937    PT Time Calculation (min)  52 min       Past Medical History:  Diagnosis Date  . Crohn's colitis [facility])     Past Surgical History:  Procedure Laterality Date  . COLON SURGERY    . KNEE ARTHROCENTESIS      There were no vitals filed for this visit.  Subjective Assessment - 02/17/19 0850    Subjective  The patient reports mild improvement in neck rotation.  She removed tape this morning and felt like it helped (got more support above shoulder blade and felt like she had more work below shoulder blade).    Patient Stated Goals  reduce pain; prevent these occurrences of neck pain    Currently in Pain?  Yes    Pain Score  5     Pain Location  Neck    Pain Orientation  Right    Pain Descriptors / Indicators  Spasm;Stabbing    Pain Type  Acute pain    Pain Radiating Towards  R side    Pain Onset  1 to 4 weeks ago    Pain Frequency  Constant    Aggravating Factors   movement    Pain Relieving Factors  lying down                       [facility] Adult PT Treatment/Exercise - 02/17/19 0852      Exercises   Exercises  Neck      Neck Exercises: Supine   Cervical Isometrics  Extension;Right lateral flexion;Left lateral flexion;Right rotation;Left rotation;5 secs    Cervical Isometrics Limitations  R side pain with relaxing,, L side pain during contracting    Neck Retraction  5 reps;3 secs    Capital Flexion  5 reps    Capital Flexion Limitations  attempted  segmental lifting of head off pillow, however increases pain R mid c-spine      Neck Exercises: Sidelying   Other Sidelying Exercise  capital extension with manual resistance reaching towards the floor (beginning position is in L sidelying reaching L arm to floor off top edge of mat, PT supporting head and having isometric axial extension x 3 second holds);  Performed 5 reps to R and L sides.    Other Sidelying Exercise  gravity assisted sidebending to tolerance R and L sides with PT supporting head      Neck Exercises: Prone   Axial Exension  10 reps    Axial Extension Limitations  added flexion/extension while elongating through posterior aspect of spine    Neck Retraction  10 reps    Other Prone Exercise  lateral rocking for shoulder and neck mobility      Modalities   Modalities  Electrical Stimulation;Moist Heat      Moist Heat Therapy   Number Minutes Moist Heat  12 Minutes    Moist Heat Location  Cervical  Acupuncturist Location  R upper trap, low cervical spine    Electrical Stimulation Action  premodulated    Electrical Stimulation Parameters  to tolerance    Electrical Stimulation Goals  Pain      Manual Therapy   Manual Therapy  Joint mobilization;Soft tissue mobilization;Manual Traction;Passive ROM;Neural Stretch    Manual therapy comments  Supine, prone, and sidelying    Joint Mobilization  SUPINE:  PA mid to low cervical spine grade II-III, lateral glide grade II-III R and L sides, cervical down glides R side grade II-III.  Sidelying lateral glides mid cervical spine R and L sides grade II.  PRONE:  thoracic PA mobs grade III-IV    Soft tissue mobilization  STM to paraspinal musculature in c-spine, scalenes.    Passive ROM  PROM c-spine within tolerance    Manual Traction  gentle manual tration 20 second holds x 5 reps    Neural Stretch  reaching to ground when performing sidelying cervical isometrics for neural glide              PT Education - 02/17/19 0929    Education Details  modified/progressed HEP    Person(s) Educated  Patient    Methods  Explanation;Demonstration;Handout    Comprehension  Verbalized understanding;Returned demonstration          PT Long Term Goals - 02/15/19 1442      PT LONG TERM GOAL #1   Title  The patient will be able to return demo HEP for neck flexibility, postural strengthening and stability.    Time  6    Period  Weeks    Target Date  04/01/19      PT LONG TERM GOAL #2   Title  The patient will report functional limitation < 30% per FOTO.    Baseline  66% limited    Time  6    Period  Weeks    Target Date  04/01/19      PT LONG TERM GOAL #3   Title  The patient will improve R cervical rotation to > or equal to 55 degrees.    Baseline  30 degrees.    Time  6    Period  Weeks    Target Date  04/01/19      PT LONG TERM GOAL #4   Title  The patient will improve R cervical sidebending to 25 degrees.    Baseline  10 degrees    Time  6    Period  Weeks    Target Date  04/01/19      PT LONG TERM GOAL #5   Title  The patient will report resting pain < or equal to 2/10 in R cervical spine.    Baseline  6/10    Time  6    Period  Weeks    Target Date  04/01/19            Plan - 02/17/19 0930    Clinical Impression Statement  The patient has slight improvement in ROM per subjective report.  She continues with ongoing R muscle spasm and pain.  PT added modalities at end of session for muscle relaxation after joint mobility and exercise.  PT modified HEP to add cervical isometrics and prone strengthening for neck stabilization.    Examination-Activity Limitations  Lift;Reach Overhead;Bed Mobility    Examination-Participation Restrictions  Driving    Stability/Clinical Decision Making  Stable/Uncomplicated    Rehab Potential  Good  PT Frequency  2x / week    PT Duration  6 weeks    PT Treatment/Interventions  Patient/family  education;Neuromuscular re-education;Therapeutic exercise;Therapeutic activities;Functional mobility training;ADLs/Self Care Home Management;Cryotherapy;Electrical Stimulation;Iontophoresis 4mg /ml Dexamethasone;Moist Heat;Traction;Ultrasound;Manual techniques;Taping;Dry needling;Joint Manipulations;Spinal Manipulations;Passive range of motion    PT Next Visit Plan  joint mobilization mid to upper cervical spine; thoracic spine PA mobility; HEP with thoracic extension, progress neck stabilization (prone on elbows or quadriped for anti-gravity strengthening).    PT Home Exercise Plan  Access Code:    Consulted and Agree with Plan of Care  Patient       Patient will benefit from skilled therapeutic intervention in order to improve the following deficits and impairments:  Pain, Hypomobility, Impaired flexibility, Increased muscle spasms, Decreased range of motion, Decreased strength, Postural dysfunction  Visit Diagnosis: Cervicalgia  Other muscle spasm  Abnormal posture     Problem List Patient Active Problem List   Diagnosis Date Noted  . Chronic neck pain 02/10/2019  . TENDINITIS, WRIST 10/11/2008    Perian Tedder , PT 02/17/2019, 9:32 AM  [facility] [address] [place], [place], [place] Phone: [phone]   Fax:  [phone]  Name: KINZLEE SELVY MRN: Tamala Ser Date of Birth: 24-Sep-1980

## 2019-02-17 NOTE — Patient Instructions (Signed)
Access Code: [email or web address]  URL: [email or web address]  Date: 02/17/2019  Prepared by: Margretta Ditty   Exercises Towel Roll Stretch - 10 reps - 1 sets - 2x daily - 7x weekly Supine Cervical Rotation AROM on Pillow - 10 reps - 1 sets - 2x daily - 7x weekly Cervical Extension Prone on Elbows - 10 reps - 1 sets - 2x daily - 7x weekly Supine Cervical Sidebending - Mid to End Range with Manual Resistance - 5 reps - 1 sets - 5 seconds hold - 2x daily - 7x weekly                  Supine Thoracic Mobilization Towel Roll Vertical with Arm Stretch - 1 reps - 1 sets - 2 minutes hold - 2x daily - 7x weekly

## 2019-02-21 ENCOUNTER — Other Ambulatory Visit: Payer: Self-pay

## 2019-02-21 ENCOUNTER — Ambulatory Visit (INDEPENDENT_AMBULATORY_CARE_PROVIDER_SITE_OTHER): Payer: 59

## 2019-02-21 DIAGNOSIS — M542 Cervicalgia: Secondary | ICD-10-CM | POA: Diagnosis not present

## 2019-02-21 DIAGNOSIS — G8929 Other chronic pain: Secondary | ICD-10-CM

## 2019-02-22 ENCOUNTER — Ambulatory Visit (INDEPENDENT_AMBULATORY_CARE_PROVIDER_SITE_OTHER): Payer: 59 | Admitting: Physical Therapy

## 2019-02-22 ENCOUNTER — Encounter: Payer: Self-pay | Admitting: Physical Therapy

## 2019-02-22 DIAGNOSIS — R293 Abnormal posture: Secondary | ICD-10-CM

## 2019-02-22 DIAGNOSIS — M62838 Other muscle spasm: Secondary | ICD-10-CM | POA: Diagnosis not present

## 2019-02-22 DIAGNOSIS — M542 Cervicalgia: Secondary | ICD-10-CM

## 2019-02-22 NOTE — Therapy (Signed)
[facility] [place], [place], [address] Phone: [phone]   Fax:  [phone]  Physical Therapy Treatment  Patient Details  Name: Sandra Hanson MRN: [id number] Date of Birth: 21-Oct-1980 Referring Provider (PT): Silverio Decamp, MD   Encounter Date: 02/22/2019  PT End of Session - 02/22/19 0935    Visit Number  3    Number of Visits  12    Date for PT Re-Evaluation  04/01/19    PT Start Time  0935    PT Stop Time  1019    PT Time Calculation (min)  44 min    Activity Tolerance  Patient tolerated treatment well    Behavior During Therapy  [facility] for tasks assessed/performed       Past Medical History:  Diagnosis Date  . Crohn's colitis [facility])     Past Surgical History:  Procedure Laterality Date  . COLON SURGERY    . KNEE ARTHROCENTESIS      There were no vitals filed for this visit.  Subjective Assessment - 02/22/19 0937    Subjective  Patient reports significant improvement since last visit.    Patient Stated Goals  reduce pain; prevent these occurrences of neck pain    Currently in Pain?  Yes    Pain Score  3     Pain Location  Neck    Pain Orientation  Right    Pain Descriptors / Indicators  Stabbing    Pain Type  Acute pain                       [facility] Adult PT Treatment/Exercise - 02/22/19 0001      Neck Exercises: Seated   Neck Retraction  5 reps    Neck Retraction Limitations  with rotation less comfortable than supiine    Other Seated Exercise  retraction x 5 5 sec      Neck Exercises: Supine   Cervical Isometrics  Right lateral flexion;Left lateral flexion;Right rotation;Left rotation;5 secs;5 reps    Neck Retraction  5 reps;3 secs    Capital Flexion  5 reps;5 secs    Capital Flexion Limitations  isometric    Cervical Rotation  Right;5 reps    Cervical Rotation Limitations  with chin tuck      Neck Exercises: Prone   Other Prone Exercise  prone on elbows  diagonals x 5 ea; improved seated extension after      Modalities   Modalities  Traction      Traction   Type of Traction  Cervical    Min (lbs)  3    Max (lbs)  8    Hold Time  60    Rest Time  20    Time  10   12 min total     Manual Therapy   Manual Therapy  Joint mobilization;Manual Traction    Joint Mobilization  supine PA; extension mobs C3/4, C4/5    Manual Traction  in sitting x 20 sec; then with right rotation x 5                  PT Long Term Goals - 02/15/19 1442      PT LONG TERM GOAL #1   Title  The patient will be able to return demo HEP for neck flexibility, postural strengthening and stability.    Time  6    Period  Weeks    Target Date  04/01/19      PT LONG TERM GOAL #2   Title  The patient will report functional limitation < 30% per FOTO.    Baseline  66% limited    Time  6    Period  Weeks    Target Date  04/01/19      PT LONG TERM GOAL #3   Title  The patient will improve R cervical rotation to > or equal to 55 degrees.    Baseline  30 degrees.    Time  6    Period  Weeks    Target Date  04/01/19      PT LONG TERM GOAL #4   Title  The patient will improve R cervical sidebending to 25 degrees.    Baseline  10 degrees    Time  6    Period  Weeks    Target Date  04/01/19      PT LONG TERM GOAL #5   Title  The patient will report resting pain < or equal to 2/10 in R cervical spine.    Baseline  6/10    Time  6    Period  Weeks    Target Date  04/01/19            Plan - 02/22/19 2046    Clinical Impression Statement  Patient presenting with decreased pain today. She is still limited with right rotation particularly in WB positions. She had some relief with supine neck retraction and rotation combined. She also did fairly well with distraction and rotation. She had increased cervical extension and quality of motion improved in sitting after performing prone on elbows diagonals in available ROM.  We did a trial of cervical  traction and she responded well.    PT Treatment/Interventions  Patient/family education;Neuromuscular re-education;Therapeutic exercise;Therapeutic activities;Functional mobility training;ADLs/Self Care Home Management;Cryotherapy;Electrical Stimulation;Iontophoresis 4mg /ml Dexamethasone;Moist Heat;Traction;Ultrasound;Manual techniques;Taping;Dry needling;Joint Manipulations;Spinal Manipulations;Passive range of motion    PT Next Visit Plan  joint mobilization mid to upper cervical spine; thoracic spine PA mobility; HEP with thoracic extension, progress neck stabilization (prone on elbows or quadriped for anti-gravity strengthening).       Patient will benefit from skilled therapeutic intervention in order to improve the following deficits and impairments:  Pain, Hypomobility, Impaired flexibility, Increased muscle spasms, Decreased range of motion, Decreased strength, Postural dysfunction  Visit Diagnosis: Cervicalgia  Other muscle spasm  Abnormal posture     Problem List Patient Active Problem List   Diagnosis Date Noted  . Chronic neck pain 02/10/2019  . TENDINITIS, WRIST 10/11/2008   10/13/2008 PT 02/22/2019, 8:51 PM  [facility] [address] [place], [place], [place] Phone: [phone]   Fax:  [phone]  Name: Sandra Hanson MRN: Tamala Ser Date of Birth: May 06, 1980

## 2019-02-24 ENCOUNTER — Encounter: Payer: Self-pay | Admitting: Physical Therapy

## 2019-02-24 ENCOUNTER — Other Ambulatory Visit: Payer: Self-pay

## 2019-02-24 ENCOUNTER — Ambulatory Visit (INDEPENDENT_AMBULATORY_CARE_PROVIDER_SITE_OTHER): Payer: 59 | Admitting: Sports Medicine

## 2019-02-24 ENCOUNTER — Ambulatory Visit (INDEPENDENT_AMBULATORY_CARE_PROVIDER_SITE_OTHER): Payer: 59 | Admitting: Physical Therapy

## 2019-02-24 DIAGNOSIS — M542 Cervicalgia: Secondary | ICD-10-CM | POA: Diagnosis not present

## 2019-02-24 DIAGNOSIS — M5030 Other cervical disc degeneration, unspecified cervical region: Secondary | ICD-10-CM | POA: Diagnosis not present

## 2019-02-24 DIAGNOSIS — M62838 Other muscle spasm: Secondary | ICD-10-CM | POA: Diagnosis not present

## 2019-02-24 DIAGNOSIS — R293 Abnormal posture: Secondary | ICD-10-CM

## 2019-02-24 MED ORDER — MELOXICAM 15 MG PO TABS: ORAL_TABLET | ORAL | 3 refills | Status: DC

## 2019-02-24 NOTE — Therapy (Signed)
[place], [place], [address] Phone: [phone]   Fax:  [phone]  Physical Therapy Treatment  Patient Details  Name: Sandra Hanson MRN: [id number] Date of Birth: 11/21/1980 Referring Provider (PT): Silverio Decamp, MD   Encounter Date: 02/24/2019  PT End of Session - 02/24/19 0929    Visit Number  4    Number of Visits  12    Date for PT Re-Evaluation  04/01/19    PT Start Time  0930    PT Stop Time  1018    PT Time Calculation (min)  48 min    Activity Tolerance  Patient tolerated treatment well    Behavior During Therapy  [facility] for tasks assessed/performed       Past Medical History:  Diagnosis Date  . Crohn's colitis [facility])     Past Surgical History:  Procedure Laterality Date  . COLON SURGERY    . KNEE ARTHROCENTESIS      There were no vitals filed for this visit.  Subjective Assessment - 02/24/19 0930    Subjective  Patient reports just some soreness with traction, but then yesterday had intermittent occurences of pain down her post left arm to elbow. Her ROM is better though.    Patient Stated Goals  reduce pain; prevent these occurrences of neck pain    Currently in Pain?  Yes    Pain Score  4     Pain Location  Neck    Pain Orientation  Right                       [facility] Adult PT Treatment/Exercise - 02/24/19 0001      Neck Exercises: Standing   Neck Retraction  10 reps;5 secs    Neck Retraction Limitations  against wall    Other Standing Exercises  Neuro re-ed using laser glasses: rotation, flex/ext and diagonals multiple reps      Neck Exercises: Prone   Neck Retraction  10 reps;5 secs    Neck Retraction Limitations  6 on elbows caused pinching in lower cervical; so went to prone    Other Prone Exercise  prone on elbows diagonals x 10 ea; Right rotation WNL afterwards      Manual Therapy   Manual Therapy  Joint mobilization;Soft tissue  mobilization    Joint Mobilization  prone PA mobs gd III C3-5    Soft tissue mobilization  to bil cerv paraspinals      Neck Exercises: Stretches   Upper Trapezius Stretch  Right;1 rep;30 seconds   with arm behind back and using strap for assist            PT Education - 02/24/19 1621    Education Details  progressed HEP    Person(s) Educated  Patient    Methods  Explanation;Demonstration;Handout    Comprehension  Verbalized understanding;Returned demonstration          PT Long Term Goals - 02/24/19 1625      PT LONG TERM GOAL #1   Title  The patient will be able to return demo HEP for neck flexibility, postural strengthening and stability.    Status  On-going      PT LONG TERM GOAL #3   Title  The patient will improve R cervical rotation to > or equal to 55 degrees.    Baseline  > 55 deg today after neuro re-ed    Status  Partially  Met      PT LONG TERM GOAL #4   Title  The patient will improve R cervical sidebending to 25 degrees.    Status  On-going      PT LONG TERM GOAL #5   Title  The patient will report resting pain < or equal to 2/10 in R cervical spine.    Status  On-going            Plan - 02/24/19 1621    Clinical Impression Statement  Patient reported intermittent pain into left upper arm since traction last visit so we held it today. She does have improved ROM in right rotation since last visit and this increased further today with neuroreed exercises. She did have increased tone in right cervical paraspinals, but this resolved with manual therapy. LTGs are progressing.    PT Frequency  2x / week    PT Duration  6 weeks    PT Treatment/Interventions  Patient/family education;Neuromuscular re-education;Therapeutic exercise;Therapeutic activities;Functional mobility training;ADLs/Self Care Home Management;Cryotherapy;Electrical Stimulation;Iontophoresis 13m/ml Dexamethasone;Moist Heat;Traction;Ultrasound;Manual techniques;Taping;Dry needling;Joint  Manipulations;Spinal Manipulations;Passive range of motion    PT Next Visit Plan  joint mobilization mid to upper cervical spine; thoracic spine PA mobility; HEP with thoracic extension, progress neck stabilization (prone on elbows or quadriped for anti-gravity strengthening).    PT Home Exercise Plan  Access Code: [email or web address]      Patient will benefit from skilled therapeutic intervention in order to improve the following deficits and impairments:  Pain, Hypomobility, Impaired flexibility, Increased muscle spasms, Decreased range of motion, Decreased strength, Postural dysfunction  Visit Diagnosis: Cervicalgia  Other muscle spasm  Abnormal posture     Problem List Patient Active Problem List   Diagnosis Date Noted  . DDD (degenerative disc disease), cervical 02/10/2019  . TENDINITIS, WRIST 10/11/2008    JMadelyn FlavorsPT 02/24/2019, 4:39 PM  [facility] [place] [address]Phone: [phone]  Fax:  [phone] Name: Sandra PERELLAMRN: [id number] of Birth: [birth year]

## 2019-02-24 NOTE — Progress Notes (Signed)
    Procedures performed today:    None.  Independent interpretation of tests performed by another provider:   None.  Impression and Recommendations:    DDD (degenerative disc disease), cervical Sandra Hanson returns, she is a pleasant 39 year old female with left-sided cervical radiculitis. She improved considerably with the aggressive treatment at the last visit but continues to have some discomfort, radiating down the left arm. MRI did show C3-C4 C4-C5 DDD with some compression of the right C5 nerve root, at the C4-C5 level by foraminal compression is possible. Improving considerably with PT but because she still has some discomfort we are going to add meloxicam. If failure of meloxicam after a couple weeks I will add gabapentin in a slow up taper. We will reserve epidurals as a last resort. I would like to see her back in a month and a virtual Doximity visit.    ___________________________________________ Ihor Austin. Benjamin Stain, M.D., ABFM., CAQSM. Primary Care and Sports Medicine [facility]  Adjunct Instructor of Family Medicine  [employer]

## 2019-02-24 NOTE — Assessment & Plan Note (Signed)
Sandra Hanson returns, she is a pleasant 39 year old female with left-sided cervical radiculitis. She improved considerably with the aggressive treatment at the last visit but continues to have some discomfort, radiating down the left arm. MRI did show C3-C4 C4-C5 DDD with some compression of the right C5 nerve root, at the C4-C5 level by foraminal compression is possible. Improving considerably with PT but because she still has some discomfort we are going to add meloxicam. If failure of meloxicam after a couple weeks I will add gabapentin in a slow up taper. We will reserve epidurals as a last resort. I would like to see her back in a month and a virtual Doximity visit.

## 2019-02-24 NOTE — Patient Instructions (Signed)
Access Code: [email or web address]  URL: [email or web address]  Date: 02/24/2019  Prepared by: Raynelle Fanning Recia Sons   Exercises Towel Roll Stretch - 10 reps - 1 sets - 2x daily - 7x weekly Supine Cervical Rotation AROM on Pillow - 10 reps - 1 sets - 2x daily - 7x weekly Cervical Extension Prone on Elbows - 10 reps - 1 sets - 2x daily - 7x weekly Supine Cervical Sidebending - Mid to End Range with Manual Resistance - 5 reps - 1 sets - 5 seconds hold - 2x daily - 7x weekly                  Supine Thoracic Mobilization Towel Roll Vertical with Arm Stretch - 1 reps - 1 sets - 2 minutes hold - 2x daily - 7x weekly Cervical Rotation Prone on Elbows - 10 reps - 1 sets - 2-3x daily - 7x weekly Prone Cervical Retraction Off Table - 10 reps - 2 sets - 5 sec hold - 1x daily - 7x weekly

## 2019-03-01 ENCOUNTER — Ambulatory Visit (INDEPENDENT_AMBULATORY_CARE_PROVIDER_SITE_OTHER): Payer: 59 | Admitting: Physical Therapy

## 2019-03-01 ENCOUNTER — Other Ambulatory Visit: Payer: Self-pay

## 2019-03-01 ENCOUNTER — Encounter: Payer: Self-pay | Admitting: Physical Therapy

## 2019-03-01 DIAGNOSIS — M62838 Other muscle spasm: Secondary | ICD-10-CM | POA: Diagnosis not present

## 2019-03-01 DIAGNOSIS — R293 Abnormal posture: Secondary | ICD-10-CM | POA: Diagnosis not present

## 2019-03-01 DIAGNOSIS — M542 Cervicalgia: Secondary | ICD-10-CM

## 2019-03-01 NOTE — Therapy (Signed)
[place], [place], [address] Phone: [phone]   Fax:  [phone]  Physical Therapy Treatment  Patient Details  Name: Sandra Hanson MRN: [id number] Date of Birth: [birth year] Referring Provider (PT): Silverio Decamp, MD   Encounter Date: 03/01/2019  PT End of Session - 03/01/19 1347    Visit Number  5    Number of Visits  12    Date for PT Re-Evaluation  04/01/19    PT Start Time  9242    PT Stop Time  1432    PT Time Calculation (min)  45 min    Activity Tolerance  Patient tolerated treatment well    Behavior During Therapy  [facility] for tasks assessed/performed       Past Medical History:  Diagnosis Date  . Crohn's colitis [facility])     Past Surgical History:  Procedure Laterality Date  . COLON SURGERY    . KNEE ARTHROCENTESIS      There were no vitals filed for this visit.  Subjective Assessment - 03/01/19 1347    Subjective  Patient reports ROM has improved with flex/ext and rotation but SB still bad to the right.    Patient Stated Goals  reduce pain; prevent these occurrences of neck pain    Currently in Pain?  No/denies                       [facility] Adult PT Treatment/Exercise - 03/01/19 0001      Neck Exercises: Standing   Other Standing Exercises  Neuro re-ed using laser glasses: rotation, flex/ext and diagonals multiple reps    Other Standing Exercises  lateral flex x 5 right      Neck Exercises: Prone   Other Prone Exercise  prone on elbow with arm reaches x 5 ea    Other Prone Exercise  prone on elbows diagonals x 10 ea; Right rotation WNL afterwards      Neck Exercises: Stabilization   Stabilization  quadriped: chin tuck x 5; with arm reaches x 10 ea; alt arm/leg x 5 each      Manual Therapy   Manual Therapy  Joint mobilization;Soft tissue mobilization    Manual therapy comments  skilled palpation and monitoring of soft tissue during DN      Joint  Mobilization  pronePA mobs gd III C3-5; supine lateral glides    Soft tissue mobilization  to bil cerv paraspinals       Trigger Point Dry Needling - 03/01/19 0001    Consent Given?  Yes    Education Handout Provided  Previously provided    Muscles Treated Head and Neck  Cervical multifidi    Dry Needling Comments  right    Cervical multifidi Response  Twitch reponse elicited;Palpable increased muscle length           PT Education - 03/01/19 1435    Education Details  DN education and aftercare    Person(s) Educated  Patient    Methods  Explanation    Comprehension  Verbalized understanding          PT Long Term Goals - 02/24/19 1625      PT LONG TERM GOAL #1   Title  The patient will be able to return demo HEP for neck flexibility, postural strengthening and stability.    Status  On-going      PT LONG TERM GOAL #3   Title  The patient  will improve R cervical rotation to > or equal to 55 degrees.    Baseline  > 55 deg today after neuro re-ed    Status  Partially Met      PT LONG TERM GOAL #4   Title  The patient will improve R cervical sidebending to 25 degrees.    Status  On-going      PT LONG TERM GOAL #5   Title  The patient will report resting pain < or equal to 2/10 in R cervical spine.    Status  On-going            Plan - 03/01/19 1436    Clinical Impression Statement  Patient presents with reports of improvements overall. She has no pain in the LUE and ROM is better with flex/ext and rotation. She still has pain with right SB and is limited to 25 deg. This did not improve at end of treatment, however she had less pain with Rt SB after DN. Noticeable improvements with neuro re-ed today.    PT Treatment/Interventions  Patient/family education;Neuromuscular re-education;Therapeutic exercise;Therapeutic activities;Functional mobility training;ADLs/Self Care Home Management;Cryotherapy;Electrical Stimulation;Iontophoresis 12m/ml Dexamethasone;Moist  Heat;Traction;Ultrasound;Manual techniques;Taping;Dry needling;Joint Manipulations;Spinal Manipulations;Passive range of motion    PT Next Visit Plan  assess DN, assess goals, joint mobilization mid to upper cervical spine; thoracic spine PA mobility; HEP with thoracic extension, progress neck stabilization (prone on elbows or quadriped for anti-gravity strengthening).    PT Home Exercise Plan  Access Code: [email or web address]   Consulted and Agree with Plan of Care  Patient       Patient will benefit from skilled therapeutic intervention in order to improve the following deficits and impairments:  Pain, Hypomobility, Impaired flexibility, Increased muscle spasms, Decreased range of motion, Decreased strength, Postural dysfunction  Visit Diagnosis: Cervicalgia  Other muscle spasm  Abnormal posture     Problem List Patient Active Problem List   Diagnosis Date Noted  . DDD (degenerative disc disease), cervical 02/10/2019  . TENDINITIS, WRIST 10/11/2008    JMadelyn FlavorsPT 03/01/2019, 5:10 PM  [facility] [address]Phone: [phone]  Fax:  [phone] Name: Sandra KUECHLEMRN: [id number] of Birth: 506/18/1982

## 2019-03-03 ENCOUNTER — Other Ambulatory Visit: Payer: Self-pay

## 2019-03-03 ENCOUNTER — Encounter: Payer: Self-pay | Admitting: Physical Therapy

## 2019-03-03 ENCOUNTER — Ambulatory Visit (INDEPENDENT_AMBULATORY_CARE_PROVIDER_SITE_OTHER): Payer: 59 | Admitting: Physical Therapy

## 2019-03-03 DIAGNOSIS — M542 Cervicalgia: Secondary | ICD-10-CM

## 2019-03-03 DIAGNOSIS — M62838 Other muscle spasm: Secondary | ICD-10-CM

## 2019-03-03 DIAGNOSIS — R293 Abnormal posture: Secondary | ICD-10-CM

## 2019-03-03 NOTE — Therapy (Signed)
[place], [place], [address] Phone: [phone]   Fax:  [phone]  Physical Therapy Treatment  Patient Details  Name: ROSSIE SCARFONE MRN: [id number] Date of Birth: [birth year] Referring Provider (PT): Silverio Decamp, MD   Encounter Date: 03/03/2019  PT End of Session - 03/03/19 0934    Visit Number  6    Number of Visits  12    Date for PT Re-Evaluation  04/01/19    PT Start Time  0934    PT Stop Time  1025    PT Time Calculation (min)  51 min    Activity Tolerance  Patient tolerated treatment well    Behavior During Therapy  [facility] for tasks assessed/performed       Past Medical History:  Diagnosis Date  . Crohn's colitis [facility])     Past Surgical History:  Procedure Laterality Date  . COLON SURGERY    . KNEE ARTHROCENTESIS      There were no vitals filed for this visit.  Subjective Assessment - 03/03/19 0935    Subjective  Patient feels needling helped some. SB is better.    Patient Stated Goals  reduce pain; prevent these occurrences of neck pain    Currently in Pain?  No/denies         [facility] PT Assessment - 03/03/19 0001      AROM   Cervical - Right Side Bend  40                   [facility] Adult PT Treatment/Exercise - 03/03/19 0001      Neck Exercises: Theraband   Other Theraband Exercises  neck isometrics with yellow band 10 sec hold x 10 flex and SB bil cues for chin tuck and not to lean body; mirror used for feedback on SB      Neck Exercises: Standing   Other Standing Exercises  UT shrug with 6# wts at 30 deg ABD x 10; RUE UT shrug with theraband red x 10 with cues to pull with scapula     Other Standing Exercises  mid back rotation with dowel x 3 each way with neck and hips stable (no restriction noted)      Neck Exercises: Prone   Neck Retraction  20 reps    Neck Retraction Limitations  10 with T's, 10 with Y's scapular retraction      Modalities    Modalities  Moist Heat      Moist Heat Therapy   Number Minutes Moist Heat  10 Minutes    Moist Heat Location  Cervical      Manual Therapy   Manual Therapy  Soft tissue mobilization    Manual therapy comments  skilled palpation and monitoring of soft tissue during DN      Soft tissue mobilization  to right UT and cervical paraspinals      Neck Exercises: Stretches   Other Neck Stretches  attempted upper back trap stretch with minisquat position but was not felt       Trigger Point Dry Needling - 03/03/19 0001    Consent Given?  Yes    Education Handout Provided  Previously provided    Muscles Treated Head and Neck  Upper trapezius;Splenius capitus;Cervical multifidi    Dry Needling Comments  right    Upper Trapezius Response  Twitch reponse elicited;Palpable increased muscle length    Splenius capitus Response  Palpable increased muscle length  Cervical multifidi Response  Twitch reponse elicited;Palpable increased muscle length           PT Education - 03/03/19 1015    Education Details  HEP progressed    Person(s) Educated  Patient    Methods  Explanation;Demonstration;Handout    Comprehension  Verbalized understanding;Returned demonstration          PT Long Term Goals - 03/03/19 1506      PT LONG TERM GOAL #1   Title  The patient will be able to return demo HEP for neck flexibility, postural strengthening and stability.    Status  On-going      PT LONG TERM GOAL #3   Title  The patient will improve R cervical rotation to > or equal to 55 degrees.    Baseline  full cervical Rotation 03/03/19    Status  Achieved      PT LONG TERM GOAL #4   Title  The patient will improve R cervical sidebending to 25 degrees.    Baseline  40 deg 03/03/19    Status  Achieved      PT LONG TERM GOAL #5   Title  The patient will report resting pain < or equal to 2/10 in R cervical spine.    Baseline  0/10 today    Status  Partially Met            Plan - 03/03/19  1504    Clinical Impression Statement  Patient presents today without pain and with increased Rt cervical SB to 40 deg and full cervical rotation bil. We worked on cervical stabilization exercises and she had increased soreness and tension afterwards. Her Right UT had visible increased tone at start of treatment. Patient responded well to DN again with decreased tension noted.    PT Frequency  2x / week    PT Duration  6 weeks    PT Treatment/Interventions  Patient/family education;Neuromuscular re-education;Therapeutic exercise;Therapeutic activities;Functional mobility training;ADLs/Self Care Home Management;Cryotherapy;Electrical Stimulation;Iontophoresis 73m/ml Dexamethasone;Moist Heat;Traction;Ultrasound;Manual techniques;Taping;Dry needling;Joint Manipulations;Spinal Manipulations;Passive range of motion    PT Next Visit Plan  assess response to DN in UT and neck; continue with cervcial stabilization TE; DN prn    PT Home Exercise Plan  Access Code: [email or web address]   Consulted and Agree with Plan of Care  Patient       Patient will benefit from skilled therapeutic intervention in order to improve the following deficits and impairments:  Pain, Hypomobility, Impaired flexibility, Increased muscle spasms, Decreased range of motion, Decreased strength, Postural dysfunction  Visit Diagnosis: Cervicalgia  Other muscle spasm  Abnormal posture     Problem List Patient Active Problem List   Diagnosis Date Noted  . DDD (degenerative disc disease), cervical 02/10/2019  . TENDINITIS, WRIST 10/11/2008    JMadelyn FlavorsPT 03/03/2019, 3:18 PM  [facility] [address]Phone: [phone]  Fax:  [phone] Name: JVIVI PICCIRILLIMRN: [id number] of Birth: 503-Sep-1982

## 2019-03-03 NOTE — Patient Instructions (Signed)
Access Code: [email or web address]  URL: [email or web address]  Date: 03/03/2019  Prepared by: Raynelle Fanning Aala Ransom   Exercises Towel Roll Stretch - 10 reps - 1 sets - 2x daily - 7x weekly Supine Cervical Rotation AROM on Pillow - 10 reps - 1 sets - 2x daily - 7x weekly Cervical Extension Prone on Elbows - 10 reps - 1 sets - 2x daily - 7x weekly Supine Cervical Sidebending - Mid to End Range with Manual Resistance - 5 reps - 1 sets - 5 seconds hold - 2x daily - 7x weekly                  Supine Thoracic Mobilization Towel Roll Vertical with Arm Stretch - 1 reps - 1 sets - 2 minutes hold - 2x daily - 7x weekly Cervical Rotation Prone on Elbows - 10 reps - 1 sets - 2-3x daily - 7x weekly Prone Cervical Retraction Off Table - 10 reps - 2 sets - 5 sec hold - 1x daily - 7x weekly Standing Isometric Cervical Flexion with Anchored Resistance - 5 reps - 1-3 sets - 10 sec hold - 1x daily - 7x weekly Prone Cervical Retracion Off End of Bed - 10 reps - 3 sets - 1x daily - 7x weekly Patient Education Trigger Point Dry Needling

## 2019-03-10 ENCOUNTER — Other Ambulatory Visit: Payer: Self-pay

## 2019-03-10 ENCOUNTER — Ambulatory Visit (INDEPENDENT_AMBULATORY_CARE_PROVIDER_SITE_OTHER): Payer: 59 | Admitting: Physical Therapy

## 2019-03-10 ENCOUNTER — Encounter: Payer: Self-pay | Admitting: Physical Therapy

## 2019-03-10 DIAGNOSIS — R293 Abnormal posture: Secondary | ICD-10-CM

## 2019-03-10 DIAGNOSIS — M542 Cervicalgia: Secondary | ICD-10-CM

## 2019-03-10 DIAGNOSIS — M62838 Other muscle spasm: Secondary | ICD-10-CM

## 2019-03-10 NOTE — Therapy (Signed)
[place], [place], [address] Phone: [phone]   Fax:  [phone]  Physical Therapy Treatment  Patient Details  Name: SADAKO CEGIELSKI MRN: [id number] Date of Birth: [birth year] Referring Provider (PT): Silverio Decamp, MD   Encounter Date: 03/10/2019  PT End of Session - 03/10/19 0933    Visit Number  7    Number of Visits  12    Date for PT Re-Evaluation  04/01/19    PT Start Time  0933    PT Stop Time  1015    PT Time Calculation (min)  42 min    Activity Tolerance  Patient tolerated treatment well    Behavior During Therapy  [facility] for tasks assessed/performed       Past Medical History:  Diagnosis Date  . Crohn's colitis [facility])     Past Surgical History:  Procedure Laterality Date  . COLON SURGERY    . KNEE ARTHROCENTESIS      There were no vitals filed for this visit.  Subjective Assessment - 03/10/19 0934    Subjective  Patient reports neck is doing well. A little stiff this morning.    Patient Stated Goals  reduce pain; prevent these occurrences of neck pain    Currently in Pain?  No/denies                       [facility] Adult PT Treatment/Exercise - 03/10/19 0001      Neck Exercises: Seated   Cervical Rotation  Both;5 reps    Lateral Flexion  Both;5 reps      Manual Therapy   Manual Therapy  Joint mobilization    Manual therapy comments  skilled palpation and monitoring of soft tissue during DN      Joint Mobilization  PA mobs bil to Cspine; overpressure to right C5/6 with Rt SB x 5; 1st rib mobs left     Soft tissue mobilization  IASTM to bil UT and cervical paraspinals    Manual Traction  seated x 30 sec   decreased pain with Rt SB after     Neck Exercises: Stretches   Other Neck Stretches  1st rib mob/stretch 5 sec hold x 5 then 2x 30 sec hold; Right side x 2, then 2x with head drop left.       Trigger Point Dry Needling - 03/10/19 0001    Consent Given?  Yes    Education Handout Provided  Previously provided    Muscles Treated Head and Neck  Upper trapezius;Cervical multifidi;Levator scapulae    Dry Needling Comments  bil    Upper Trapezius Response  Twitch reponse elicited;Palpable increased muscle length    Levator Scapulae Response  Palpable increased muscle length    Cervical multifidi Response  Twitch reponse elicited;Palpable increased muscle length                PT Long Term Goals - 03/10/19 0935      PT LONG TERM GOAL #1   Title  The patient will be able to return demo HEP for neck flexibility, postural strengthening and stability.    Status  Partially Met      PT LONG TERM GOAL #5   Title  The patient will report resting pain < or equal to 2/10 in R cervical spine.    Status  Achieved            Plan - 03/10/19 1026  Clinical Impression Statement  Patient continues to report improvements. She is still experiencing intermittent pinching in Rt C4-C6 with right SB which is relieved with DN. Manual traction also provides relief. Right scapula was slightly elevated at start of treatment, but this also was corrected after manual therapy. Overall pt reports increased function and mobility.    PT Frequency  2x / week    PT Duration  6 weeks    PT Treatment/Interventions  Patient/family education;Neuromuscular re-education;Therapeutic exercise;Therapeutic activities;Functional mobility training;ADLs/Self Care Home Management;Cryotherapy;Electrical Stimulation;Iontophoresis 41m/ml Dexamethasone;Moist Heat;Traction;Ultrasound;Manual techniques;Taping;Dry needling;Joint Manipulations;Spinal Manipulations;Passive range of motion    PT Next Visit Plan  continue with cervcial stabilization TE, STW to scalenes; would like to try mechanical traction again if pt willing.    PT Home Exercise Plan  Access Code: [email or web address]   Consulted and Agree with Plan of Care  Patient       Patient will benefit from skilled  therapeutic intervention in order to improve the following deficits and impairments:  Pain, Hypomobility, Impaired flexibility, Increased muscle spasms, Decreased range of motion, Decreased strength, Postural dysfunction  Visit Diagnosis: Cervicalgia  Other muscle spasm  Abnormal posture     Problem List Patient Active Problem List   Diagnosis Date Noted  . DDD (degenerative disc disease), cervical 02/10/2019  . TENDINITIS, WRIST 10/11/2008    JMadelyn FlavorsPT 03/10/2019, 10:40 AM  [facility] [address]Phone: [phone]  Fax:  [phone] Name: JEMELIA SANDOVALMRN: [id number] of Birth: 5Aug 08, 1982

## 2019-03-12 ENCOUNTER — Encounter: Payer: Self-pay | Admitting: Physical Therapy

## 2019-03-12 ENCOUNTER — Other Ambulatory Visit: Payer: Self-pay

## 2019-03-12 ENCOUNTER — Ambulatory Visit (INDEPENDENT_AMBULATORY_CARE_PROVIDER_SITE_OTHER): Payer: 59 | Admitting: Physical Therapy

## 2019-03-12 DIAGNOSIS — R293 Abnormal posture: Secondary | ICD-10-CM

## 2019-03-12 DIAGNOSIS — M542 Cervicalgia: Secondary | ICD-10-CM | POA: Diagnosis not present

## 2019-03-12 DIAGNOSIS — M62838 Other muscle spasm: Secondary | ICD-10-CM

## 2019-03-12 NOTE — Therapy (Signed)
[place], [place], [address] Phone: [phone]   Fax:  [phone]  Physical Therapy Treatment  Patient Details  Name: LAKEITA PANTHER MRN: [id number] Date of Birth: February 09, 1980 Referring Provider (PT): Silverio Decamp, MD   Encounter Date: 03/12/2019  PT End of Session - 03/12/19 1448    Visit Number  8    Number of Visits  12    Date for PT Re-Evaluation  04/01/19    PT Start Time  8185    PT Stop Time  1530    PT Time Calculation (min)  41 min       Past Medical History:  Diagnosis Date  . Crohn's colitis [facility])     Past Surgical History:  Procedure Laterality Date  . COLON SURGERY    . KNEE ARTHROCENTESIS      There were no vitals filed for this visit.  Subjective Assessment - 03/12/19 1448    Subjective  She still gets "knife in neck" with Rt side bending.  She said for the most part she is doing well.  She was very sore in Lt neck after DN last session.    Patient Stated Goals  reduce pain; prevent these occurrences of neck pain    Currently in Pain?  No/denies    Pain Score  0-No pain         [facility] PT Assessment - 03/12/19 0001      Assessment   Medical Diagnosis  neck pain    Referring Provider (PT)  Silverio Decamp, MD    Onset Date/Surgical Date  --   2 weeks ago   Hand Dominance  Right      AROM   Cervical - Right Side Bend  38   with pain       [facility] Adult PT Treatment/Exercise - 03/12/19 0001      Neck Exercises: Machines for Strengthening   UBE (Upper Arm Bike)  L1: 1 min forward/1 min backward standing for warm up.       Neck Exercises: Standing   Other Standing Exercises  verbally reviewed current HEP.       Neck Exercises: Sidelying   Other Sidelying Exercise  Rt lateral sidebending with resistance from PT x 3 reps (during manual therapy)      Neck Exercises: Prone   Other Prone Exercise  prone on elbow with arm reaches x 5 ea    Other  Prone Exercise  prone on elbows diagonals x 10 each       Modalities   Modalities  Electrical Stimulation;Ultrasound      Electrical Stimulation   Electrical Stimulation Location  Rt cervical paraspinals/ levator     Electrical Stimulation Action  combo [place]    Electrical Stimulation Parameters  intensity to tolerance    Electrical Stimulation Goals  Pain      Ultrasound   Ultrasound Location  see estim     Ultrasound Parameters  combo [place], 1.2 w/cm2, 8 min, 100%     Ultrasound Goals  Pain      Manual Therapy   Joint Mobilization  Rt to Lt lateral glide mid c-spine in supine and Lt sidelying, grade 2-3 and PA mob C3-C5 grade II;  provided by Rudell Cobb, PT   Soft tissue mobilization  IASTM and STM to Rt upper trap and cervical paraspinals    Passive ROM  PROM c-spine within tolerance      Neck Exercises:  Stretches   Other Neck Stretches  midlevel doorway stretch x 20 sec x 3 reps                   PT Long Term Goals - 03/12/19 1540      PT LONG TERM GOAL #1   Title  The patient will be able to return demo HEP for neck flexibility, postural strengthening and stability.    Status  Partially Met      PT LONG TERM GOAL #2   Title  The patient will report functional limitation < 30% per FOTO.    Baseline  66% limited    Time  6    Period  Weeks    Status  On-going      PT LONG TERM GOAL #3   Title  The patient will improve R cervical rotation to > or equal to 55 degrees.    Baseline  full cervical Rotation 03/03/19    Time  6    Period  Weeks    Status  Achieved      PT LONG TERM GOAL #4   Title  The patient will improve R cervical sidebending to 25 degrees.    Baseline  40 deg 03/03/19    Time  6    Period  Weeks    Status  Achieved      PT LONG TERM GOAL #5   Title  The patient will report resting pain < or equal to 2/10 in R cervical spine.    Baseline  0/10 today    Time  6    Period  Weeks    Status  Achieved            Plan - 03/12/19  1531    Clinical Impression Statement  PA mobility limited at C3, per supervising PT, Rudell Cobb.  She has made good progress, yet continues to have pain at end range of Rt lateral sidebending. Encouraged pt to alter work station to avoid prolonged Rt rotation.  Mild improvement in cervical sidebending after manual therapy.    PT Frequency  2x / week    PT Duration  6 weeks    PT Treatment/Interventions  Patient/family education;Neuromuscular re-education;Therapeutic exercise;Therapeutic activities;Functional mobility training;ADLs/Self Care Home Management;Cryotherapy;Electrical Stimulation;Iontophoresis 61m/ml Dexamethasone;Moist Heat;Traction;Ultrasound;Manual techniques;Taping;Dry needling;Joint Manipulations;Spinal Manipulations;Passive range of motion    PT Next Visit Plan  continue with cervcial stabilization TE, STW to scalenes.    PT Home Exercise Plan  Access Code: [email or web address]   Consulted and Agree with Plan of Care  Patient       Patient will benefit from skilled therapeutic intervention in order to improve the following deficits and impairments:  Pain, Hypomobility, Impaired flexibility, Increased muscle spasms, Decreased range of motion, Decreased strength, Postural dysfunction  Visit Diagnosis: Cervicalgia  Other muscle spasm  Abnormal posture     Problem List Patient Active Problem List   Diagnosis Date Noted  . DDD (degenerative disc disease), cervical 02/10/2019  . TENDINITIS, WRIST 10/11/2008   JKerin Perna PTA 03/12/19 3:41 PM WRudell Cobb PT  [facility] [address]Phone: [phone]  Fax:  [phone] Name: JGRETHEL ZENKMRN: [id number] of Birth: [birth year]

## 2019-03-15 ENCOUNTER — Other Ambulatory Visit: Payer: Self-pay

## 2019-03-15 ENCOUNTER — Ambulatory Visit (INDEPENDENT_AMBULATORY_CARE_PROVIDER_SITE_OTHER): Payer: 59 | Admitting: Rehabilitative and Restorative Service Providers"

## 2019-03-15 ENCOUNTER — Encounter: Payer: Self-pay | Admitting: Rehabilitative and Restorative Service Providers"

## 2019-03-15 DIAGNOSIS — M62838 Other muscle spasm: Secondary | ICD-10-CM

## 2019-03-15 DIAGNOSIS — R293 Abnormal posture: Secondary | ICD-10-CM

## 2019-03-15 DIAGNOSIS — M542 Cervicalgia: Secondary | ICD-10-CM | POA: Diagnosis not present

## 2019-03-15 NOTE — Therapy (Signed)
[place], [place], [address] Phone: [phone]   Fax:  [phone]  Physical Therapy Treatment  Patient Details  Name: Sandra Hanson MRN: [id number] Date of Birth: 03/03/1980 Referring Provider (PT): Silverio Decamp, MD   Encounter Date: 03/15/2019  PT End of Session - 03/15/19 0852    Visit Number  9    Number of Visits  12    Date for PT Re-Evaluation  04/01/19    PT Start Time  0848    PT Stop Time  0930    PT Time Calculation (min)  42 min       Past Medical History:  Diagnosis Date  . Crohn's colitis [facility])     Past Surgical History:  Procedure Laterality Date  . COLON SURGERY    . KNEE ARTHROCENTESIS      There were no vitals filed for this visit.  Subjective Assessment - 03/15/19 0851    Subjective  The patient reports she can move further into R sidebending before she gets stabbing pain R side (didn't feel it yesterday, but back today).    Patient Stated Goals  reduce pain; prevent these occurrences of neck pain    Currently in Pain?  No/denies                       [facility] Adult PT Treatment/Exercise - 03/15/19 0852      Exercises   Exercises  Neck      Neck Exercises: Machines for Strengthening   UBE (Upper Arm Bike)  L3 2 minutes forward, 1 minute backward      Neck Exercises: Seated   Cervical Rotation  Right;Left;5 reps    Cervical Rotation Limitations  with towel for mobilization with movement    Lateral Flexion  Right;Left;5 reps    Lateral Flexion Limitations  with towel for mobilization with movement    Other Seated Exercise  mobilization with movement with neck extension performing anterior pull with belt to perform PA self mobilization    Other Seated Exercise  --      Neck Exercises: Sidelying   Lateral Flexion  Right;Left;10 reps    Lateral Flexion Limitations  with a towel roll and one pillow to encourage full ROM      Neck  Exercises: Prone   Axial Exension  10 reps    Axial Extension Limitations  note significant tightness in splenius capitus during prone exercise    Other Prone Exercise  Prone on elbows with cervical flexion/extension, then diagonal head motion, and rotation x 10 reps each.    Other Prone Exercise  Child's pose trunk rotation for scapular stretching, prone child's pose for thoracic extension *also modified in standing for thoracic extension      Manual Therapy   Manual Therapy  Joint mobilization;Soft tissue mobilization    Manual therapy comments  to reduce pain, improve joint moiblity and improve postural ROM    Joint Mobilization  R<>L lateral mobilization in supine and sidelying grade II-III, PA thoracic grade III prone, PA cervical in supine grade II, mobilizaton with movement, sidelying lateral glide grade II-III mid cervical spine    Soft tissue mobilization  STM splenius capitus, levator, scalenes and deep neck stabilizers    Passive ROM  PROM with overpressure and joint mobilization into R sidebending.             PT Education - 03/15/19 0929    Education Details  HEP    Person(s) Educated  Patient    Methods  Explanation;Demonstration;Handout    Comprehension  Verbalized understanding;Returned demonstration          PT Long Term Goals - 03/12/19 1540      PT LONG TERM GOAL #1   Title  The patient will be able to return demo HEP for neck flexibility, postural strengthening and stability.    Status  Partially Met      PT LONG TERM GOAL #2   Title  The patient will report functional limitation < 30% per FOTO.    Baseline  66% limited    Time  6    Period  Weeks    Status  On-going      PT LONG TERM GOAL #3   Title  The patient will improve R cervical rotation to > or equal to 55 degrees.    Baseline  full cervical Rotation 03/03/19    Time  6    Period  Weeks    Status  Achieved      PT LONG TERM GOAL #4   Title  The patient will improve R cervical  sidebending to 25 degrees.    Baseline  40 deg 03/03/19    Time  6    Period  Weeks    Status  Achieved      PT LONG TERM GOAL #5   Title  The patient will report resting pain < or equal to 2/10 in R cervical spine.    Baseline  0/10 today    Time  6    Period  Weeks    Status  Achieved            Plan - 03/15/19 1304    Clinical Impression Statement  The patient has improved ROM and reduced plane in all planes/movements except R sidebending. PT working on joint mobilization to reduce pain and improve motion.  Continue working to [employer], joint mobility, and strengthening.    PT Frequency  2x / week    PT Duration  6 weeks    PT Treatment/Interventions  Patient/family education;Neuromuscular re-education;Therapeutic exercise;Therapeutic activities;Functional mobility training;ADLs/Self Care Home Management;Cryotherapy;Electrical Stimulation;Iontophoresis 32m/ml Dexamethasone;Moist Heat;Traction;Ultrasound;Manual techniques;Taping;Dry needling;Joint Manipulations;Spinal Manipulations;Passive range of motion    PT Next Visit Plan  continue with cervcial stabilization TE, STW to scalenes, joint mobiization upper thoracic spine and lateral glides mid c-spine.    PT Home Exercise Plan  Access Code: [email or web address]   Consulted and Agree with Plan of Care  Patient       Patient will benefit from skilled therapeutic intervention in order to improve the following deficits and impairments:  Pain, Hypomobility, Impaired flexibility, Increased muscle spasms, Decreased range of motion, Decreased strength, Postural dysfunction  Visit Diagnosis: Cervicalgia  Other muscle spasm  Abnormal posture     Problem List Patient Active Problem List   Diagnosis Date Noted  . DDD (degenerative disc disease), cervical 02/10/2019  . TENDINITIS, WRIST 10/11/2008    Juanna Pudlo, PT 03/15/2019, 1:11 PM  [facility] [place] [place] [address]Phone: [phone]  Fax:  [phone] Name: JWILSIE KERNMRN: [id number] of Birth: 512/26/1982

## 2019-03-15 NOTE — Patient Instructions (Signed)
Access Code: [email or web address]  URL: [email or web address]  Date: 03/15/2019  Prepared by: Margretta Ditty   Exercises Supine Cervical Sidebending - Mid to End Range with Manual Resistance - 5 reps - 1 sets - 5 seconds hold - 2x daily - 7x weekly                  Supine Thoracic Mobilization Towel Roll Vertical with Arm Stretch - 1 reps - 1 sets - 2 minutes hold - 2x daily - 7x weekly Cervical Extension Prone on Elbows - 10 reps - 1 sets - 2x daily - 7x weekly Cervical Rotation Prone on Elbows - 10 reps - 1 sets - 2-3x daily - 7x weekly Prone Cervical Retraction Off Table - 10 reps - 2 sets - 5 sec hold - 1x daily - 7x weekly Prone Cervical Retracion Off End of Bed - 10 reps - 3 sets - 1x daily - 7x weekly Standing Isometric Cervical Flexion with Anchored Resistance - 5 reps - 1-3 sets - 10 sec hold - 1x daily - 7x weekly Prone Chest Stretch on Chair - 3 reps - 1 sets - 20 seconds hold - 2x daily - 7x weekly

## 2019-03-17 ENCOUNTER — Ambulatory Visit (INDEPENDENT_AMBULATORY_CARE_PROVIDER_SITE_OTHER): Payer: 59 | Admitting: Physical Therapy

## 2019-03-17 ENCOUNTER — Other Ambulatory Visit: Payer: Self-pay

## 2019-03-17 DIAGNOSIS — R293 Abnormal posture: Secondary | ICD-10-CM | POA: Diagnosis not present

## 2019-03-17 DIAGNOSIS — M542 Cervicalgia: Secondary | ICD-10-CM

## 2019-03-17 DIAGNOSIS — M62838 Other muscle spasm: Secondary | ICD-10-CM | POA: Diagnosis not present

## 2019-03-17 NOTE — Patient Instructions (Signed)
TENS UNIT: This is helpful for muscle pain and spasm.   Search and Purchase a TENS 7000 2nd edition at www.Amazon.com    TENS unit instructions: Do not shower or bathe with the unit on Turn the unit off before removing electrodes or batteries If the electrodes lose stickiness add a drop of water to the electrodes after they are disconnected from the unit and place on plastic sheet. If you continued to have difficulty, call the TENS unit company to purchase more electrodes. Do not apply lotion on the skin area prior to use. Make sure the skin is clean and dry as this will help prolong the life of the electrodes. After use, always check skin for unusual red areas, rash or other skin difficulties. If there are any skin problems, does not apply electrodes to the same area. Never remove the electrodes from the unit by pulling the wires. Do not use the TENS unit or electrodes other than as directed. Do not change electrode placement without consultating your therapist or physician. Keep 2 fingers with between each electrode. Wear time ratio is 2:1, on to off times.    For example on for 30 minutes off for 15 minutes and then on for 30 minutes off for 15 minutes

## 2019-03-17 NOTE — Therapy (Signed)
[place], [place], [address] Phone: [phone]   Fax:  [phone]  Physical Therapy Treatment  Patient Details  Name: Sandra Hanson MRN: [id number] Date of Birth: Jun 21, 1980 Referring Provider (PT): Silverio Decamp, MD   Encounter Date: 03/17/2019  PT End of Session - 03/17/19 1430    Visit Number  10    Number of Visits  12    Date for PT Re-Evaluation  04/01/19    PT Start Time  0762    PT Stop Time  1435    PT Time Calculation (min)  50 min    Activity Tolerance  Patient tolerated treatment well    Behavior During Therapy  [facility] for tasks assessed/performed       Past Medical History:  Diagnosis Date  . Crohn's colitis [facility])     Past Surgical History:  Procedure Laterality Date  . COLON SURGERY    . KNEE ARTHROCENTESIS      There were no vitals filed for this visit.  Subjective Assessment - 03/17/19 1651    Subjective  Pt is having less stabbing pain in Rt neck; now just very sore on both side of neck.  She reports she stopped taking medicine yesterday and wonders if this has affected neck, too.    Patient Stated Goals  reduce pain; prevent these occurrences of neck pain    Currently in Pain?  Yes    Pain Score  3     Pain Location  Neck    Pain Orientation  Right;Left;Lateral    Pain Descriptors / Indicators  Dull;Sore    Aggravating Factors   end range cervical lateral flexion    Pain Relieving Factors  ?         [facility] PT Assessment - 03/17/19 0001      Assessment   Medical Diagnosis  neck pain    Referring Provider (PT)  Silverio Decamp, MD    Onset Date/Surgical Date  --   2 weeks ago   Hand Dominance  Right    Next MD Visit  03/24/19      AROM   Cervical Flexion  60    Cervical Extension  60    Cervical - Right Side Bend  43    Cervical - Left Side Bend  47    Cervical - Right Rotation  70    Cervical - Left Rotation  75      [facility] Adult PT  Treatment/Exercise - 03/17/19 0001      Neck Exercises: Seated   Lateral Flexion  Right;Left;5 reps    Lateral Flexion Limitations  with towel for mobilization with movement    Other Seated Exercise  thoracic ext over back of chair x 2 reps, hands supporting head.       Neck Exercises: Supine   Other Supine Exercise  hooklying on full foam roller x 5 reps of snow angels;  thoracic ext over pool noodle > full foam roller x 5 reps of 5 sec hold;  self mobilization on small massage blocks x 6 reps (various segments)       Moist Heat Therapy   Number Minutes Moist Heat  10 Minutes    Moist Heat Location  Cervical   and thoracic      Electrical Stimulation   Electrical Stimulation Location  bilat cervical paraspinals     Electrical Stimulation Action  IFC    Electrical Stimulation Parameters  10  min, intensity to tolerance     Electrical Stimulation Goals  Pain      Manual Therapy   Soft tissue mobilization  STM and IASTM to splenius capitus, levator, scalenes and deep neck stabilizers             PT Education - 03/17/19 1431    Education Details  TENS info    Person(s) Educated  Patient    Methods  Explanation    Comprehension  Verbalized understanding          PT Long Term Goals - 03/12/19 1540      PT LONG TERM GOAL #1   Title  The patient will be able to return demo HEP for neck flexibility, postural strengthening and stability.    Status  Partially Met      PT LONG TERM GOAL #2   Title  The patient will report functional limitation < 30% per FOTO.    Baseline  66% limited    Time  6    Period  Weeks    Status  On-going      PT LONG TERM GOAL #3   Title  The patient will improve R cervical rotation to > or equal to 55 degrees.    Baseline  full cervical Rotation 03/03/19    Time  6    Period  Weeks    Status  Achieved      PT LONG TERM GOAL #4   Title  The patient will improve R cervical sidebending to 25 degrees.    Baseline  40 deg 03/03/19    Time  6     Period  Weeks    Status  Achieved      PT LONG TERM GOAL #5   Title  The patient will report resting pain < or equal to 2/10 in R cervical spine.    Baseline  0/10 today    Time  6    Period  Weeks    Status  Achieved            Plan - 03/17/19 1431    Clinical Impression Statement  Pt's cervical ROM has improved significantly since initiating therapy.  Address thoracic tightness and self mobilization today. Pt reported reduction in neck pain after IASTM, and further reduction with use of estim/MHP at end of session.  Near meeting remaining goals.    Rehab Potential  Good    PT Frequency  2x / week    PT Duration  6 weeks    PT Treatment/Interventions  Patient/family education;Neuromuscular re-education;Therapeutic exercise;Therapeutic activities;Functional mobility training;ADLs/Self Care Home Management;Cryotherapy;Electrical Stimulation;Iontophoresis 37m/ml Dexamethasone;Moist Heat;Traction;Ultrasound;Manual techniques;Taping;Dry needling;Joint Manipulations;Spinal Manipulations;Passive range of motion    PT Next Visit Plan  finalize HEP and prepare for d/c is still doing well.    PT Home Exercise Plan  Access Code: [email or web address]   Consulted and Agree with Plan of Care  Patient       Patient will benefit from skilled therapeutic intervention in order to improve the following deficits and impairments:  Pain, Hypomobility, Impaired flexibility, Increased muscle spasms, Decreased range of motion, Decreased strength, Postural dysfunction  Visit Diagnosis: Cervicalgia  Other muscle spasm  Abnormal posture     Problem List Patient Active Problem List   Diagnosis Date Noted  . DDD (degenerative disc disease), cervical 02/10/2019  . TENDINITIS, WRIST 10/11/2008    JKerin Perna PTA 03/17/19 4:52 PM  [place] [place]  [place] Phone: [phone]   Fax:  [phone]  Name:  Sandra Hanson MRN: [id number] Date of Birth: [birth year]

## 2019-03-22 ENCOUNTER — Encounter: Payer: Self-pay | Admitting: Rehabilitative and Restorative Service Providers"

## 2019-03-22 ENCOUNTER — Ambulatory Visit (INDEPENDENT_AMBULATORY_CARE_PROVIDER_SITE_OTHER): Payer: 59 | Admitting: Rehabilitative and Restorative Service Providers"

## 2019-03-22 ENCOUNTER — Other Ambulatory Visit: Payer: Self-pay

## 2019-03-22 DIAGNOSIS — R293 Abnormal posture: Secondary | ICD-10-CM

## 2019-03-22 DIAGNOSIS — M542 Cervicalgia: Secondary | ICD-10-CM | POA: Diagnosis not present

## 2019-03-22 DIAGNOSIS — M62838 Other muscle spasm: Secondary | ICD-10-CM | POA: Diagnosis not present

## 2019-03-22 NOTE — Therapy (Signed)
[place], [place], [address] Phone: [phone]   Fax:  [phone]  Physical Therapy Treatment  Patient Details  Name: Sandra Hanson MRN: [id number] Date of Birth: [birth year] Referring Provider (PT): Silverio Decamp, MD   Encounter Date: 03/22/2019  PT End of Session - 03/22/19 0944    Visit Number  11    Number of Visits  12    Date for PT Re-Evaluation  04/01/19    PT Start Time  0935    PT Stop Time  1015    PT Time Calculation (min)  40 min    Activity Tolerance  Patient tolerated treatment well    Behavior During Therapy  [facility] for tasks assessed/performed       Past Medical History:  Diagnosis Date  . Crohn's colitis [facility])     Past Surgical History:  Procedure Laterality Date  . COLON SURGERY    . KNEE ARTHROCENTESIS      There were no vitals filed for this visit.  Subjective Assessment - 03/22/19 0935    Subjective  The patient reports she no longer gets a pinch with R sidebending, however she continues with general sensation of tightness both sides.  She stopped taking the mobic for one day and had increased pain, therefore, she returned to taking it at this time.  She has f/u visit with Dr. Darene Lamer on Wednesday 3/3.    Patient Stated Goals  reduce pain; prevent these occurrences of neck pain    Currently in Pain?  Yes    Pain Location  Neck    Pain Orientation  Right;Left    Pain Descriptors / Indicators  Sore;Tightness    Pain Onset  More than a month ago    Pain Frequency  Constant    Aggravating Factors   feels tightness bilateral upper trap, scalenes and upper back    Pain Relieving Factors  stretches    Effect of Pain on Daily Activities  able to fit ther ex in 1x/day.                       [facility] Adult PT Treatment/Exercise - 03/22/19 1331      Self-Care   Self-Care  Other Self-Care Comments    Other Self-Care Comments   Discussed online yoga for  posture and neck tension.  Discussed continuation of HEP after discharge.        Exercises   Exercises  Neck;Shoulder      Neck Exercises: Seated   Lateral Flexion  Right;Left;5 reps    Lateral Flexion Limitations  upper trapezius stretch    Other Seated Exercise  seat levator stretch x 2 reps x 20 seconds      Neck Exercises: Supine   Other Supine Exercise  thoracic extension towel roll for chest opening      Shoulder Exercises: Standing   Retraction  Strengthening;Right;Left;10 reps    Theraband Level (Shoulder Retraction)  Level 2 (Red)    Retraction Limitations  fatigues with 10 repetitions      Manual Therapy   Manual Therapy  Soft tissue mobilization    Manual therapy comments  to reduce tightness in cervical musculature    Joint Mobilization  cspine PA mobiization grade II    Soft tissue mobilization  STM upper trapezius bilat, L scalenes, L levator             PT Education - 03/22/19 7253  Education Details  HEP    Person(s) Educated  Patient    Methods  Explanation;Demonstration;Handout    Comprehension  Returned demonstration;Verbalized understanding          PT Long Term Goals - 03/22/19 1001      PT LONG TERM GOAL #1   Title  The patient will be able to return demo HEP for neck flexibility, postural strengthening and stability.    Status  --      PT LONG TERM GOAL #2   Title  The patient will report functional limitation < 30% per FOTO.    Baseline  scores 75% (25% limited)    Time  6    Period  Weeks    Status  Achieved      PT LONG TERM GOAL #3   Title  The patient will improve R cervical rotation to > or equal to 55 degrees.    Baseline  full cervical Rotation 03/03/19    Time  6    Period  Weeks    Status  Achieved      PT LONG TERM GOAL #4   Title  The patient will improve R cervical sidebending to 25 degrees.    Baseline  40 deg 03/03/19    Time  6    Period  Weeks    Status  Achieved      PT LONG TERM GOAL #5   Title  The  patient will report resting pain < or equal to 2/10 in R cervical spine.    Baseline  0/10 today    Time  6    Period  Weeks    Status  Achieved            Plan - 03/22/19 1338    Clinical Impression Statement  The patient has met 4 LTGs and has follow up with MD on Wednesday. She plans to return to clinic Thursday as needed to f/u on HEP and perform STM as needed.  If she feels the exercises are going well and she can progress without further intervention, she will call to cancel.    Rehab Potential  Good    PT Frequency  2x / week    PT Duration  6 weeks    PT Treatment/Interventions  Patient/family education;Neuromuscular re-education;Therapeutic exercise;Therapeutic activities;Functional mobility training;ADLs/Self Care Home Management;Cryotherapy;Electrical Stimulation;Iontophoresis 19m/ml Dexamethasone;Moist Heat;Traction;Ultrasound;Manual techniques;Taping;Dry needling;Joint Manipulations;Spinal Manipulations;Passive range of motion    PT Next Visit Plan  finalize HEP and prepare for d/c is still doing well.    PT Home Exercise Plan  Access Code: [email or web address]   Consulted and Agree with Plan of Care  Patient       Patient will benefit from skilled therapeutic intervention in order to improve the following deficits and impairments:  Pain, Hypomobility, Impaired flexibility, Increased muscle spasms, Decreased range of motion, Decreased strength, Postural dysfunction  Visit Diagnosis: Cervicalgia  Other muscle spasm  Abnormal posture     Problem List Patient Active Problem List   Diagnosis Date Noted  . DDD (degenerative disc disease), cervical 02/10/2019  . TENDINITIS, WRIST 10/11/2008    Josslyn Ciolek , PT 03/22/2019, 1:40 PM  [facility] [place] [address]Phone: [phone]  Fax:  [phone] Name: Sandra GIRDLERMRN: [id number] of Birth: [birth year]

## 2019-03-22 NOTE — Patient Instructions (Signed)
Access Code: [email or web address]  URL: [email or web address]  Date: 03/22/2019  Prepared by: Margretta Ditty   Program Notes  Yoga with Adriene (you tube, free); find ones for neck and upper back or yoga for posture   Exercises Supine Thoracic Mobilization Towel Roll Vertical with Arm Stretch - 1 reps - 1 sets - 2 minutes hold - 2x daily - 7x weekly Prone Chest Stretch on Chair - 3 reps - 1 sets - 20 seconds hold - 2x daily - 7x weekly Prone Cervical Retraction Off Table - 10 reps - 2 sets - 5 sec hold - 1x daily - 7x weekly Prone Cervical Retracion Off End of Bed - 10 reps - 3 sets - 1x daily - 7x weekly Standing Isometric Cervical Flexion with Anchored Resistance - 5 reps - 1-3 sets - 10 sec hold - 1x daily - 7x weekly Seated Gentle Upper Trapezius Stretch - 3 reps - 1 sets - 20 seconds hold - 2x daily - 7x weekly Standing Shoulder Horizontal Abduction with Resistance - 10 reps - 1 sets - 1x daily - 7x weekly

## 2019-03-24 ENCOUNTER — Ambulatory Visit (INDEPENDENT_AMBULATORY_CARE_PROVIDER_SITE_OTHER): Payer: 59 | Admitting: Sports Medicine

## 2019-03-24 DIAGNOSIS — M5030 Other cervical disc degeneration, unspecified cervical region: Secondary | ICD-10-CM | POA: Diagnosis not present

## 2019-03-24 MED ORDER — GABAPENTIN 100 MG PO CAPS: ORAL_CAPSULE | ORAL | 11 refills | Status: DC

## 2019-03-24 NOTE — Assessment & Plan Note (Signed)
This pleasant 39 year old female returns, she has left-sided cervical radiculitis, much better with meloxicam, she still has some discomfort, we are going to go ahead and add gabapentin very low dose, as she works third shift and has really no protected time to sleep after working. She will do a slow up taper. Would like to touch base with her again in a month and we can consider epidurals at that time if needed.

## 2019-03-24 NOTE — Progress Notes (Signed)
   Virtual Visit via WebEx/MyChart   I connected with  DYONNA JASPERS  on 03/24/19 via WebEx/MyChart/Doximity Video and verified that I am speaking with the correct person using two identifiers.   I discussed the limitations, risks, security and privacy concerns of performing an evaluation and management service by WebEx/MyChart/Doximity Video, including the higher likelihood of inaccurate diagnosis and treatment, and the availability of in person appointments.  We also discussed the likely need of an additional face to face encounter for complete and high quality delivery of care.  I also discussed with the patient that there may be a patient responsible charge related to this service. The patient expressed understanding and wishes to proceed.  Provider location is either at home or medical facility. Patient location is at their home, different from provider location. People involved in care of the patient during this telehealth encounter were myself, my nurse/medical assistant, and my front office/scheduling team member.  Review of Systems: No fevers, chills, night sweats, weight loss, chest pain, or shortness of breath.   Objective Findings:    General: Speaking full sentences, no audible heavy breathing.  Sounds alert and appropriately interactive.  Appears well.  Face symmetric.  Extraocular movements intact.  Pupils equal and round.  No nasal flaring or accessory muscle use visualized.  Independent interpretation of tests performed by another provider:   None.  Impression and Recommendations:    DDD (degenerative disc disease), cervical This pleasant 39 year old female returns, she has left-sided cervical radiculitis, much better with meloxicam, she still has some discomfort, we are going to go ahead and add gabapentin very low dose, as she works third shift and has really no protected time to sleep after working. She will do a slow up taper. Would like to touch base with her again in  a month and we can consider epidurals at that time if needed.   I discussed the above assessment and treatment plan with the patient. The patient was provided an opportunity to ask questions and all were answered. The patient agreed with the plan and demonstrated an understanding of the instructions.   The patient was advised to call back or seek an in-person evaluation if the symptoms worsen or if the condition fails to improve as anticipated.   I provided 30 minutes of face to face and non-face-to-face time during this encounter date, time was needed to gather information, review chart, records, communicate/coordinate with staff remotely, as well as complete documentation.   ___________________________________________ Ihor Austin. Benjamin Stain, M.D., ABFM., CAQSM. Primary Care and Sports Medicine [facility]  Adjunct Instructor of Family Medicine  [employer]

## 2019-03-25 ENCOUNTER — Other Ambulatory Visit: Payer: Self-pay

## 2019-03-25 ENCOUNTER — Ambulatory Visit (INDEPENDENT_AMBULATORY_CARE_PROVIDER_SITE_OTHER): Payer: 59 | Admitting: Physical Therapy

## 2019-03-25 DIAGNOSIS — M62838 Other muscle spasm: Secondary | ICD-10-CM

## 2019-03-25 DIAGNOSIS — R293 Abnormal posture: Secondary | ICD-10-CM | POA: Diagnosis not present

## 2019-03-25 DIAGNOSIS — M542 Cervicalgia: Secondary | ICD-10-CM

## 2019-03-25 NOTE — Therapy (Addendum)
[place], [place], [address] Phone: [phone]   Fax:  [phone]  Physical Therapy Treatment and Discharge Summary  Patient Details  Name: Sandra Hanson MRN: [id number] Date of Birth: 17-Dec-1980 Referring Provider (PT): Silverio Decamp, MD   Encounter Date: 03/25/2019  PT End of Session - 03/25/19 0936    Visit Number  12    Number of Visits  12    Date for PT Re-Evaluation  04/01/19    PT Start Time  0934    PT Stop Time  1005    PT Time Calculation (min)  31 min    Activity Tolerance  Patient tolerated treatment well    Behavior During Therapy  [facility] for tasks assessed/performed       Past Medical History:  Diagnosis Date  . Crohn's colitis [facility])     Past Surgical History:  Procedure Laterality Date  . COLON SURGERY    . KNEE ARTHROCENTESIS      There were no vitals filed for this visit.  Subjective Assessment - 03/25/19 0939    Subjective  Pt would like assistance with TENS application.  She forgot to take Mobic yesterday and she noticed she was "more sore" throughout day.    Patient Stated Goals  reduce pain; prevent these occurrences of neck pain    Currently in Pain?  No/denies    Pain Score  0-No pain   only at end range of Rt lateral flexion   Pain Onset  More than a month ago         [facility] PT Assessment - 03/25/19 0001      Assessment   Medical Diagnosis  neck pain    Referring Provider (PT)  Silverio Decamp, MD    Onset Date/Surgical Date  --   2 weeks ago   Hand Dominance  Right      [facility] Adult PT Treatment/Exercise - 03/25/19 0001      Self-Care   Self-Care  Other Self-Care Comments    Other Self-Care Comments   Pt instructed in self IASTM technique for neck musculature; pt returned demo with cues.  Pt instructed in TENS application, including safety and parameters; pt verbalized understanding and returned demo.       Moist Heat Therapy   Number Minutes Moist Heat  10 Minutes    Moist Heat Location  Cervical      Electrical Stimulation   Electrical Stimulation Location  bilat cervical paraspinals and upper traps     Electrical Stimulation Action  TENS     Electrical Stimulation Parameters  10 min, intensity to tolerance    Electrical Stimulation Goals  Pain      Manual Therapy   Soft tissue mobilization  IASTM to cervical paraspinals, levator, scalenes and upper trap to decrease fascial restrictions           PT Long Term Goals - 03/25/19 1012      PT LONG TERM GOAL #1   Title  The patient will be able to return demo HEP for neck flexibility, postural strengthening and stability.    Status  Achieved      PT LONG TERM GOAL #2   Title  The patient will report functional limitation < 30% per FOTO.    Baseline  scores 75% (25% limited)    Time  6    Period  Weeks    Status  Achieved      PT  LONG TERM GOAL #3   Title  The patient will improve R cervical rotation to > or equal to 55 degrees.    Baseline  full cervical Rotation 03/03/19    Time  6    Period  Weeks    Status  Achieved      PT LONG TERM GOAL #4   Title  The patient will improve R cervical sidebending to 25 degrees.    Baseline  40 deg 03/03/19    Time  6    Period  Weeks    Status  Achieved      PT LONG TERM GOAL #5   Title  The patient will report resting pain < or equal to 2/10 in R cervical spine.    Baseline  0/10 today    Time  6    Period  Weeks    Status  Achieved            Plan - 03/25/19 1009    Clinical Impression Statement  Pt overall doing well, managing symptoms in neck with exercises and medicine.  She requested education on self care of TENS; shown this and IASTM.  Pt has met all goals and verbalized readiness to d/c.    Rehab Potential  Good    PT Frequency  2x / week    PT Duration  6 weeks    PT Treatment/Interventions  Patient/family education;Neuromuscular re-education;Therapeutic exercise;Therapeutic  activities;Functional mobility training;ADLs/Self Care Home Management;Cryotherapy;Electrical Stimulation;Iontophoresis 29m/ml Dexamethasone;Moist Heat;Traction;Ultrasound;Manual techniques;Taping;Dry needling;Joint Manipulations;Spinal Manipulations;Passive range of motion    PT Next Visit Plan  spoke to supervising PT; will d/c to HEP at this time.    PT Home Exercise Plan  Access Code: [email or web address]   Consulted and Agree with Plan of Care  Patient       Patient will benefit from skilled therapeutic intervention in order to improve the following deficits and impairments:  Pain, Hypomobility, Impaired flexibility, Increased muscle spasms, Decreased range of motion, Decreased strength, Postural dysfunction  Visit Diagnosis: Cervicalgia  Abnormal posture  Other muscle spasm  PHYSICAL THERAPY DISCHARGE SUMMARY  Visits from Start of Care: 12  Current functional level related to goals / functional outcomes: See above   Remaining deficits: Intermittent soreness   Education / Equipment: HEP  Plan: Patient agrees to discharge.  Patient goals were met. Patient is being discharged due to meeting the stated rehab goals.  ?????         Thank you for the referral of this patient. CRudell Cobb MPT   Problem List Patient Active Problem List   Diagnosis Date Noted  . DDD (degenerative disc disease), cervical 02/10/2019  . TENDINITIS, WRIST 10/11/2008   JKerin Perna PTA 03/25/19 10:12 AM  [address] [address]Phone: [phone]  Fax:  [phone] Name: JAMBRE KOBAYASHIMRN: [id number] of Birth: [birth year]

## 2019-05-05 NOTE — Telephone Encounter (Signed)
Routing to provider  

## 2019-08-24 ENCOUNTER — Encounter (INDEPENDENT_AMBULATORY_CARE_PROVIDER_SITE_OTHER): Payer: 59

## 2019-08-24 DIAGNOSIS — M5030 Other cervical disc degeneration, unspecified cervical region: Secondary | ICD-10-CM

## 2019-08-24 MED ORDER — CYCLOBENZAPRINE HCL 10 MG PO TABS: 10.0000 mg | ORAL_TABLET | Freq: Three times a day (TID) | ORAL | 0 refills | Status: DC | PRN

## 2020-01-17 MED ORDER — CYCLOBENZAPRINE HCL 10 MG PO TABS: 10.0000 mg | ORAL_TABLET | Freq: Three times a day (TID) | ORAL | 0 refills | Status: DC | PRN

## 2020-03-07 DIAGNOSIS — I10 Essential (primary) hypertension: Secondary | ICD-10-CM | POA: Insufficient documentation

## 2020-03-28 ENCOUNTER — Other Ambulatory Visit: Payer: Self-pay

## 2020-03-28 ENCOUNTER — Emergency Department (INDEPENDENT_AMBULATORY_CARE_PROVIDER_SITE_OTHER): Payer: 59

## 2020-03-28 ENCOUNTER — Emergency Department (INDEPENDENT_AMBULATORY_CARE_PROVIDER_SITE_OTHER)
Admission: RE | Admit: 2020-03-28 | Discharge: 2020-03-28 | Disposition: A | Discharge status: Home / Self Care | Payer: 59 | Source: Ambulatory Visit

## 2020-03-28 VITALS — BP 147/97 | HR 94 | Temp 98.9°F | Resp 16 | Ht 65.0 in | Wt 170.0 lb

## 2020-03-28 DIAGNOSIS — M542 Cervicalgia: Secondary | ICD-10-CM

## 2020-03-28 DIAGNOSIS — M549 Dorsalgia, unspecified: Secondary | ICD-10-CM

## 2020-03-28 DIAGNOSIS — W182XXA Fall in (into) shower or empty bathtub, initial encounter: Secondary | ICD-10-CM | POA: Diagnosis not present

## 2020-03-28 DIAGNOSIS — M545 Low back pain, unspecified: Secondary | ICD-10-CM | POA: Diagnosis not present

## 2020-03-28 MED ORDER — ACETAMINOPHEN 325 MG PO TABS
650.0000 mg | ORAL_TABLET | Freq: Once | ORAL | Status: AC
  Administered 2020-03-28: 650 mg via ORAL

## 2020-03-28 MED ORDER — TRAMADOL-ACETAMINOPHEN 37.5-325 MG PO TABS: 1.0000 | ORAL_TABLET | Freq: Four times a day (QID) | ORAL | 0 refills | Status: DC | PRN

## 2020-03-28 MED ORDER — METHYLPREDNISOLONE 4 MG PO TBPK: ORAL_TABLET | ORAL | 0 refills | Status: DC

## 2020-03-28 NOTE — Discharge Instructions (Signed)
Take the steroid pack as directed Take all of day 1 today Take pain medicine as needed Precaution for drowsiness on the pain medicine Call today for follow-up with Dr. Benjamin Stain Continue ice or heat to painful areas Activity as tolerated

## 2020-03-28 NOTE — ED Triage Notes (Signed)
Slipped & fell when getting into the tub last Thursday  Continues w/ neck & back pain since then  Advil yesterday -muscle relaxer last night - min relief No meds today

## 2020-03-28 NOTE — ED Provider Notes (Signed)
[facility]    CSN: [id number] Arrival date & time: 03/28/20  0954      History   Chief Complaint Chief Complaint  Patient presents with  . Neck Pain  . Back Pain    HPI Sandra Hanson is a 40 y.o. female.   HPI  Patient states that she fell getting into her bathtub last Thursday, 6 days ago.  She states that she had bubblebath in the tub and as she put her foot in there and transferred her weight into the top she slipped, twisted, fell.  She states that she hit the edge of the tub so hard with her upper back and shoulder region that she cracked the fiberglass tub.  She had immediate pain.  Her husband helped her out of the tub.  She has Flexeril from a prior condition and was able to take that for the muscle pain.  She states that the muscle soreness is improving but she still has sharp pain in her lower neck and upper back regions.  Pain is worse with movement.  She has taken some Aleve for pain although with her Crohn's disease anti-inflammatory drugs are not advisable. No loss of strength in arms or legs, no loss of balance or dexterity.  She did not hit her head or lose consciousness. Patient has significant Crohn's disease.  It is controlled with Humira injections once a week.  She has also had vitamin B12 deficiency and receives vitamin B12 injections. She states that she had a lot of steroids in her youth.  She did have a bone density performed because of frequent fractures.  She does not think that she has diminished bone density  Past Medical History:  Diagnosis Date  . Crohn's colitis [facility])     Patient Active Problem List   Diagnosis Date Noted  . Essential hypertension 03/07/2020  . DDD (degenerative disc disease), cervical 02/10/2019  . B12 deficiency 12/20/2014  . Crohn's disease (HCC) 06/20/2011  . Mitral valve prolapse 12/20/2010  . TENDINITIS, WRIST 10/11/2008    Past Surgical History:  Procedure Laterality Date  . COLON SURGERY    . KNEE  ARTHROCENTESIS      OB History   No obstetric history on file.      Home Medications    Prior to Admission medications   Medication Sig Start Date End Date Taking? Authorizing Provider  cyanocobalamin (,VITAMIN B-12,) 1000 MCG/ML injection INJECT 1 ML (1,000 MCG TOTAL) UNDER THE SKIN EVERY  3O DAYS 03/07/20  Yes [provider]  cyclobenzaprine (FLEXERIL) 10 MG tablet Take 1 tablet (10 mg total) by mouth 3 (three) times daily as needed for muscle spasms. 01/17/20  Yes Monica Becton, MD  methylPREDNISolone (MEDROL DOSEPAK) 4 MG TBPK tablet tad 03/28/20  Yes Eustace Moore, MD  traMADol-acetaminophen (ULTRACET) 37.5-325 MG tablet Take 1-2 tablets by mouth every 6 (six) hours as needed. 03/28/20  Yes Eustace Moore, MD  Adalimumab (HUMIRA) 40 MG/0.4ML PSKT Inject into the skin once a week.    [provider]  gabapentin (NEURONTIN) 100 MG capsule One tab PO qHS for a week, then BID for a week, then TID. Patient not taking: Reported on 03/28/2020 03/24/19 03/28/20  Monica Becton, MD    Family History Family History  Problem Relation Age of Onset  . Hypertension Mother   . Migraines Mother   . Fibromyalgia Mother   . Wilson's disease Father   . Cancer Father  Social History Social History   Tobacco Use  . Smoking status: Never Smoker  . Smokeless tobacco: Never Used  Vaping Use  . Vaping Use: Never used  Substance Use Topics  . Alcohol use: Yes    Alcohol/week: 1.0 standard drink    Types: 1 Standard drinks or equivalent per week  . Drug use: Not Currently     Allergies   Infliximab, Shellfish allergy, Pecan extract allergy skin test, Droperidol, Fentanyl, Metoclopramide hcl, Morphine, and Sulfonamide derivatives   Review of Systems Review of Systems See HPI  Physical Exam Triage Vital Signs ED Triage Vitals  Enc Vitals Group     BP 03/28/20 1005 (!) 147/97     Pulse Rate 03/28/20 1005 94     Resp 03/28/20 1005 16     Temp  03/28/20 1005 98.9 F (37.2 C)     Temp Source 03/28/20 1005 Oral     SpO2 03/28/20 1005 99 %     Weight 03/28/20 1007 170 lb (77.1 kg)     Height 03/28/20 1007 5\' 5"  (1.651 m)     Head Circumference --      Peak Flow --      Pain Score 03/28/20 1006 5     Pain Loc --      Pain Edu? --      Excl. in GC? --    No data found.  Updated Vital Signs BP (!) 147/97 (BP Location: Right Arm)   Pulse 94   Temp 98.9 F (37.2 C) (Oral)   Resp 16   Ht 5\' 5"  (1.651 m)   Wt 77.1 kg   SpO2 99%   BMI 28.29 kg/m  :     Physical Exam Constitutional:      General: She is not in acute distress.    Appearance: She is well-developed, normal weight and well-nourished.     Comments: Mask is in place.  Patient exhibits guarded movements and stiff posture  HENT:     Head: Normocephalic and atraumatic.     Mouth/Throat:     Mouth: Oropharynx is clear and moist.  Eyes:     Conjunctiva/sclera: Conjunctivae normal.     Pupils: Pupils are equal, round, and reactive to light.  Cardiovascular:     Rate and Rhythm: Normal rate.  Pulmonary:     Effort: Pulmonary effort is normal. No respiratory distress.  Abdominal:     General: There is no distension.     Palpations: Abdomen is soft.  Musculoskeletal:        General: No edema. Normal range of motion.     Cervical back: Normal range of motion.     Comments: There is some mild tenderness in the paraspinous muscles bilaterally in the cervical region.  Upper body the trapezius also.  There is bony tenderness over C 3 4 region.  Also bony tenderness in the L1-2 region.  Strength sensation range of motion reflexes are normal in all 4 extremities  Skin:    General: Skin is warm and dry.  Neurological:     Mental Status: She is alert.      UC Treatments / Results  Labs (all labs ordered are listed, but only abnormal results are displayed) Labs Reviewed - No data to display  EKG   Radiology DG Cervical Spine Complete  Result Date:  03/28/2020 CLINICAL DATA:  Pain following fall EXAM: CERVICAL SPINE - COMPLETE 4+ VIEW COMPARISON:  October 12, 2018 cervical radiograph; cervical MRI February 21, 2019 FINDINGS: Frontal, lateral, open-mouth odontoid, and bilateral oblique views were obtained. There is no fracture or spondylolisthesis. Prevertebral soft tissues and predental space regions are normal. Disc spaces appear unremarkable. There is persistent facet hypertrophy at C3-4 bilaterally and to a lesser degree at C2-3 bilaterally. No new facet hypertrophy evident. Lung bases are clear. IMPRESSION: Facet hypertrophy at C3-4 bilaterally and to a lesser degree at C2-3 bilaterally, noted previously. No appreciable disc space narrowing. No fracture or spondylolisthesis. Electronically Signed   By: Bretta Bang III M.D.   On: 03/28/2020 11:05   DG Lumbar Spine Complete  Result Date: 03/28/2020 CLINICAL DATA:  Pain following fall EXAM: LUMBAR SPINE - COMPLETE 4+ VIEW COMPARISON:  None. FINDINGS: Frontal, lateral, spot lumbosacral lateral, and bilateral oblique views were obtained. There are 5 non-rib-bearing lumbar type vertebral bodies. There is mild lumbar levoscoliosis. There is no appreciable fracture. There are pars defects at L5 bilaterally with 3 mm of anterolisthesis of L5 on S1. No other spondylolisthesis. Disc spaces appear unremarkable. No appreciable facet arthropathy. 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 No other spondylolisthesis. No fracture. No appreciable underlying arthropathic change. Electronically Signed   By: Bretta Bang III M.D.   On: 03/28/2020 11:07    Procedures Procedures (including critical care time)  Medications Ordered in UC Medications  acetaminophen (TYLENOL) tablet 650 mg (650 mg Oral Given 03/28/20 1018)    Initial Impression / Assessment and Plan / UC Course  I have reviewed the triage vital signs and the nursing notes.  Pertinent labs & imaging  results that were available during my care of the patient were reviewed by me and considered in my medical decision making (see chart for details).     Reviewed x-ray results with patient.  No fractures identified.  We will proceed with pain management, course of steroids, referral for sports medicine consult and possible physical therapy Final Clinical Impressions(s) / UC Diagnoses   Final diagnoses:  Neck pain  Acute midline low back pain without sciatica  Fall in bathtub, initial encounter     Discharge Instructions     Take the steroid pack as directed Take all of day 1 today Take pain medicine as needed Precaution for drowsiness on the pain medicine Call today for follow-up with Dr. Benjamin Stain Continue ice or heat to painful areas Activity as tolerated    ED Prescriptions    Medication Sig Dispense Auth. Provider   methylPREDNISolone (MEDROL DOSEPAK) 4 MG TBPK tablet tad 21 tablet Eustace Moore, MD   traMADol-acetaminophen (ULTRACET) 37.5-325 MG tablet Take 1-2 tablets by mouth every 6 (six) hours as needed. 20 tablet Eustace Moore, MD     I have reviewed the PDMP during this encounter.   Eustace Moore, MD 03/28/20 1134

## 2020-04-25 ENCOUNTER — Other Ambulatory Visit: Payer: Self-pay

## 2020-04-25 ENCOUNTER — Emergency Department: Admit: 2020-04-25 | Discharge status: Absent without leave | Payer: Self-pay

## 2020-04-25 ENCOUNTER — Emergency Department (INDEPENDENT_AMBULATORY_CARE_PROVIDER_SITE_OTHER)
Admission: EM | Admit: 2020-04-25 | Discharge: 2020-04-25 | Disposition: A | Discharge status: Home / Self Care | Payer: 59 | Source: Home / Self Care | Attending: Family Medicine | Admitting: Family Medicine

## 2020-04-25 DIAGNOSIS — Z23 Encounter for immunization: Secondary | ICD-10-CM | POA: Diagnosis not present

## 2020-04-25 DIAGNOSIS — S61210A Laceration without foreign body of right index finger without damage to nail, initial encounter: Secondary | ICD-10-CM | POA: Diagnosis not present

## 2020-04-25 DIAGNOSIS — Z299 Encounter for prophylactic measures, unspecified: Secondary | ICD-10-CM

## 2020-04-25 MED ORDER — TETANUS-DIPHTH-ACELL PERTUSSIS 5-2.5-18.5 LF-MCG/0.5 IM SUSY
0.5000 mL | PREFILLED_SYRINGE | Freq: Once | INTRAMUSCULAR | Status: AC
  Administered 2020-04-25: 0.5 mL via INTRAMUSCULAR

## 2020-04-25 NOTE — ED Provider Notes (Signed)
[facility]    CSN: [id number] Arrival date & time: 04/25/20  1721      History   Chief Complaint Chief Complaint  Patient presents with  . Laceration    Sandra Hanson is a 40 y.o. female.   Sandra   Here for laceration.  Cut her left index finger on a mandolin slicer.  She has a C-shaped laceration on the dorsal surface of her left index finger over the PIP.  It pops open every time she bends her finger.  Bleeding has been controlled with pressure.  Patient has a history of Crohn's disease well-controlled on Humira  Is interested in secure repair as they are going on vacation the end of the week  Past Medical History:  Diagnosis Date  . Crohn's colitis [facility])     Patient Active Problem List   Diagnosis Date Noted  . Essential hypertension 03/07/2020  . DDD (degenerative disc disease), cervical 02/10/2019  . B12 deficiency 12/20/2014  . Crohn's disease (HCC) 06/20/2011  . Mitral valve prolapse 12/20/2010  . TENDINITIS, WRIST 10/11/2008    Past Surgical History:  Procedure Laterality Date  . COLON SURGERY    . KNEE ARTHROCENTESIS      OB History   No obstetric history on file.      Home Medications    Prior to Admission medications   Medication Sig Start Date End Date Taking? Authorizing Provider  Adalimumab (HUMIRA) 40 MG/0.4ML PSKT Inject into the skin once a week.    [provider]  cyanocobalamin (,VITAMIN B-12,) 1000 MCG/ML injection INJECT 1 ML (1,000 MCG TOTAL) UNDER THE SKIN EVERY  3O DAYS 03/07/20   [provider]  cyclobenzaprine (FLEXERIL) 10 MG tablet Take 1 tablet (10 mg total) by mouth 3 (three) times daily as needed for muscle spasms. 01/17/20   Monica Becton, MD  traMADol-acetaminophen (ULTRACET) 37.5-325 MG tablet Take 1-2 tablets by mouth every 6 (six) hours as needed. 03/28/20   Eustace Moore, MD  gabapentin (NEURONTIN) 100 MG capsule One tab PO qHS for a week, then BID for a week, then  TID. Patient not taking: Reported on 03/28/2020 03/24/19 03/28/20  Monica Becton, MD    Family History Family History  Problem Relation Age of Onset  . Hypertension Mother   . Migraines Mother   . Fibromyalgia Mother   . Wilson's disease Father   . Cancer Father     Social History Social History   Tobacco Use  . Smoking status: Never Smoker  . Smokeless tobacco: Never Used  Vaping Use  . Vaping Use: Never used  Substance Use Topics  . Alcohol use: Yes    Alcohol/week: 1.0 standard drink    Types: 1 Standard drinks or equivalent per week  . Drug use: Not Currently     Allergies   Infliximab, Shellfish allergy, Pecan extract allergy skin test, Droperidol, Fentanyl, Metoclopramide hcl, Morphine, and Sulfonamide derivatives   Review of Systems Review of Systems See Sandra  Physical Exam Triage Vital Signs ED Triage Vitals  Enc Vitals Group     BP 04/25/20 1748 (!) 151/95     Pulse Rate 04/25/20 1748 97     Resp 04/25/20 1748 18     Temp 04/25/20 1748 98.9 F (37.2 C)     Temp Source 04/25/20 1748 Oral     SpO2 04/25/20 1748 98 %     Weight 04/25/20 1749 170 lb (77.1 kg)     Height  04/25/20 1749 5\' 5"  (1.651 m)     Head Circumference --      Peak Flow --      Pain Score 04/25/20 1749 4     Pain Loc --      Pain Edu? --      Excl. in GC? --    No data found.  Updated Vital Signs BP (!) 151/95 (BP Location: Right Arm)   Pulse 97   Temp 98.9 F (37.2 C) (Oral)   Resp 18   Ht 5\' 5"  (1.651 m)   Wt 77.1 kg   SpO2 98%   BMI 28.29 kg/m      Physical Exam Constitutional:      General: She is not in acute distress.    Appearance: Normal appearance. She is well-developed.  HENT:     Head: Normocephalic and atraumatic.     Mouth/Throat:     Comments: Mask is in place Eyes:     Conjunctiva/sclera: Conjunctivae normal.     Pupils: Pupils are equal, round, and reactive to light.  Cardiovascular:     Rate and Rhythm: Normal rate.  Pulmonary:      Effort: Pulmonary effort is normal. No respiratory distress.  Abdominal:     General: There is no distension.     Palpations: Abdomen is soft.  Musculoskeletal:        General: Normal range of motion.     Cervical back: Normal range of motion.  Skin:    General: Skin is warm and dry.     Comments: C shaped laceration over the PIP knuckle, left index finger.  Total length 2 cm  Neurological:     General: No focal deficit present.     Mental Status: She is alert.     Sensory: No sensory deficit.  Psychiatric:        Behavior: Behavior normal.      UC Treatments / Results  Labs (all labs ordered are listed, but only abnormal results are displayed) Labs Reviewed - No data to display  EKG   Radiology No results found.  Procedures Laceration Repair  Date/Time: 04/25/2020 6:46 PM Performed by: , MD Authorized by: 06/25/2020, MD   Consent:    Consent obtained:  Verbal   Consent given by:  Patient   Risks, benefits, and alternatives were discussed: yes     Risks discussed:  Infection and need for additional repair Universal protocol:    Patient identity confirmed:  Verbally with patient and arm band Anesthesia:    Anesthesia method:  Local infiltration   Local anesthetic:  Lidocaine 1% w/o epi Laceration details:    Location:  Finger   Finger location:  L index finger   Length (cm):  2   Depth (mm):  5 Pre-procedure details:    Preparation:  Patient was prepped and draped in usual sterile fashion Exploration:    Limited defect created (wound extended): no     Hemostasis achieved with:  Direct pressure Treatment:    Area cleansed with:  Shur-Clens   Amount of cleaning:  Extensive   Irrigation method:  Pressure wash   Debridement:  None   Undermining:  None Skin repair:    Repair method:  Sutures   Suture size:  4-0   Suture material:  Nylon   Suture technique:  Simple interrupted   Number of sutures:  6 Approximation:     Approximation:  Close Repair type:    Repair type:  Simple Post-procedure details:    Dressing:  Antibiotic ointment, non-adherent dressing and bulky dressing   Procedure completion:  Tolerated well, no immediate complications    Medications Ordered in UC Medications  Tdap (BOOSTRIX) injection 0.5 mL (0.5 mLs Intramuscular Given 04/25/20 1820)    Initial Impression / Assessment and Plan / UC Course  I have reviewed the triage vital signs and the nursing notes.  Pertinent labs & imaging results that were available during my care of the patient were reviewed by me and considered in my medical decision making (see chart for details).     Reviewed wound care Clean and daily dressing change Reviewed signs of infections Sutures out in 14 days Final Clinical Impressions(s) / UC Diagnoses   Final diagnoses:  Preventive measure  Laceration of right index finger without foreign body without damage to nail, initial encounter     Discharge Instructions     Keep clean and reasonably dry Watch for signs of infection Sutures out at 10 to 14 days Avoid full grip and finger bending to keep from damaging stitches   ED Prescriptions    None     PDMP not reviewed this encounter.   Eustace Moore, MD 04/25/20 [place]

## 2020-04-25 NOTE — ED Triage Notes (Signed)
Left index finger Lac today cutting carrots, knife slipped cutting knuckle.  Tetanus unknown

## 2020-04-25 NOTE — Discharge Instructions (Signed)
Keep clean and reasonably dry Watch for signs of infection Sutures out at 10 to 14 days Avoid full grip and finger bending to keep from damaging stitches

## 2020-05-09 ENCOUNTER — Emergency Department
Admission: RE | Admit: 2020-05-09 | Discharge: 2020-05-09 | Disposition: A | Discharge status: Home / Self Care | Payer: 59 | Source: Ambulatory Visit | Attending: Family Medicine | Admitting: Family Medicine

## 2020-05-09 ENCOUNTER — Other Ambulatory Visit: Payer: Self-pay

## 2020-05-09 NOTE — ED Triage Notes (Signed)
Patient here for suture removal from right index finger.

## 2020-07-21 DIAGNOSIS — U071 COVID-19: Secondary | ICD-10-CM

## 2020-07-21 HISTORY — DX: COVID-19: U07.1

## 2020-08-06 ENCOUNTER — Other Ambulatory Visit: Payer: Self-pay

## 2020-08-17 ENCOUNTER — Other Ambulatory Visit: Payer: Self-pay

## 2020-08-27 ENCOUNTER — Other Ambulatory Visit: Payer: Self-pay

## 2020-08-27 ENCOUNTER — Emergency Department (INDEPENDENT_AMBULATORY_CARE_PROVIDER_SITE_OTHER)
Admission: RE | Admit: 2020-08-27 | Discharge: 2020-08-27 | Disposition: A | Discharge status: Home / Self Care | Payer: 59 | Source: Ambulatory Visit | Attending: Family Medicine | Admitting: Family Medicine

## 2020-08-27 VITALS — BP 129/82 | HR 82 | Temp 98.2°F | Resp 16 | Ht 65.0 in | Wt 178.0 lb

## 2020-08-27 DIAGNOSIS — U071 COVID-19: Secondary | ICD-10-CM | POA: Diagnosis not present

## 2020-08-27 DIAGNOSIS — R059 Cough, unspecified: Secondary | ICD-10-CM | POA: Diagnosis not present

## 2020-08-27 MED ORDER — BENZONATATE 200 MG PO CAPS: ORAL_CAPSULE | ORAL | 0 refills | Status: AC

## 2020-08-27 MED ORDER — PREDNISONE 20 MG PO TABS: ORAL_TABLET | ORAL | 0 refills | Status: AC

## 2020-08-27 NOTE — ED Provider Notes (Signed)
[facility]    CSN: [id number] Arrival date & time: 08/27/20  0849      History   Chief Complaint Chief Complaint  Patient presents with   Cough    HPI Sandra Hanson is a 40 y.o. female.   HPI  Patient is here for a cough.  Her whole family had COVID.  She states that her cough is persisting.  She states that she is on Humira for Crohn's disease.  She wonders whether she is immune compromised and this is causing her to continue coughing.  She is not having any productive sputum.  She still feels tired.  No body aches.  No chest pain with cough or deep breath.  No runny nose or sinus symptoms. States Humira is working well for her gastrointestinal distress/Crohn's He does have a history of past asthma.  She states she tried her albuterol inhaler a couple times but this did not give her any lasting relief.  She is having coughing spells where she coughs and coughs and feels like she cannot stop, feels like she is going to gag.  Past Medical History:  Diagnosis Date   COVID 07/2020   Crohn's colitis [facility])     Patient Active Problem List   Diagnosis Date Noted   Essential hypertension 03/07/2020   DDD (degenerative disc disease), cervical 02/10/2019   B12 deficiency 12/20/2014   Crohn's disease (HCC) 06/20/2011   Mitral valve prolapse 12/20/2010   TENDINITIS, WRIST 10/11/2008    Past Surgical History:  Procedure Laterality Date   COLON SURGERY     KNEE ARTHROCENTESIS      OB History   No obstetric history on file.      Home Medications    Prior to Admission medications   Medication Sig Start Date End Date Taking? Authorizing Provider  benzonatate (TESSALON) 200 MG capsule Take 1 pill 2-3 times a day for cough. 08/27/20  Yes Eustace Moore, MD  predniSONE (DELTASONE) 20 MG tablet Take 1 pill 2 times a day for 5 days then take 1 pill 1 time a day for 5 days 08/27/20  Yes Eustace Moore, MD  Adalimumab (HUMIRA) 40 MG/0.4ML PSKT Inject into the skin  once a week.    [provider]  cyanocobalamin (,VITAMIN B-12,) 1000 MCG/ML injection INJECT 1 ML (1,000 MCG TOTAL) UNDER THE SKIN EVERY  3O DAYS 03/07/20   [provider]  gabapentin (NEURONTIN) 100 MG capsule One tab PO qHS for a week, then BID for a week, then TID. Patient not taking: Reported on 03/28/2020 03/24/19 03/28/20  Monica Becton, MD    Family History Family History  Problem Relation Age of Onset   Hypertension Mother    Migraines Mother    Fibromyalgia Mother    Wilson's disease Father    Cancer Father     Social History Social History   Tobacco Use   Smoking status: Never   Smokeless tobacco: Never  Vaping Use   Vaping Use: Never used  Substance Use Topics   Alcohol use: Yes    Alcohol/week: 1.0 standard drink    Types: 1 Standard drinks or equivalent per week   Drug use: Not Currently     Allergies   Infliximab, Morphine, Other, Shellfish allergy, Droperidol, Metoclopramide hcl, Pecan extract allergy skin test, and Sulfonamide derivatives   Review of Systems Review of Systems See HPI  Physical Exam Triage Vital Signs ED Triage Vitals  Enc Vitals Group  BP 08/27/20 0911 129/82     Pulse Rate 08/27/20 0911 82     Resp 08/27/20 0911 16     Temp 08/27/20 0911 98.2 F (36.8 C)     Temp Source 08/27/20 0911 Oral     SpO2 08/27/20 0911 97 %     Weight 08/27/20 0916 178 lb (80.7 kg)     Height 08/27/20 0916 5\' 5"  (1.651 m)     Head Circumference --      Peak Flow --      Pain Score 08/27/20 0916 0     Pain Loc --      Pain Edu? --      Excl. in GC? --    No data found.  Updated Vital Signs BP 129/82 (BP Location: Right Arm)   Pulse 82   Temp 98.2 F (36.8 C) (Oral)   Resp 16   Ht 5\' 5"  (1.651 m)   Wt 80.7 kg   SpO2 97%   BMI 29.62 kg/m      Physical Exam Constitutional:      General: She is not in acute distress.    Appearance: Normal appearance. She is well-developed.  HENT:     Head: Normocephalic and  atraumatic.     Mouth/Throat:     Mouth: Mucous membranes are moist.     Pharynx: No posterior oropharyngeal erythema.  Eyes:     Conjunctiva/sclera: Conjunctivae normal.     Pupils: Pupils are equal, round, and reactive to light.  Cardiovascular:     Rate and Rhythm: Normal rate and regular rhythm.     Heart sounds: Normal heart sounds.  Pulmonary:     Effort: Pulmonary effort is normal. No respiratory distress.     Breath sounds: Normal breath sounds. No wheezing or rales.  Abdominal:     General: There is no distension.     Palpations: Abdomen is soft.  Musculoskeletal:        General: Normal range of motion.     Cervical back: Normal range of motion.  Lymphadenopathy:     Cervical: No cervical adenopathy.  Skin:    General: Skin is warm and dry.  Neurological:     Mental Status: She is alert.     UC Treatments / Results  Labs (all labs ordered are listed, but only abnormal results are displayed) Labs Reviewed - No data to display  EKG   Radiology No results found.  Procedures Procedures (including critical care time)  Medications Ordered in UC Medications - No data to display  Initial Impression / Assessment and Plan / UC Course  I have reviewed the triage vital signs and the nursing notes.  Pertinent labs & imaging results that were available during my care of the patient were reviewed by me and considered in my medical decision making (see chart for details).     I explained to patient that some people have a cough variant asthma and even though they are not wheezing they have terrible coughing spells.  I asked her to increase the use of her albuterol to see if this helps.  I am giving her Tessalon to help with cough relief.  We will get a give her 5 days of prednisone to help with the bronchial inflammation.  Hopefully this will turn the corner and she will start to feel better.  Follow-up with primary care. Final Clinical Impressions(s) / UC Diagnoses    Final diagnoses:  Cough  COVID-19  Discharge Instructions      Take prednisone as directed.  Take 2 doses today Take Tessalon 2-3 times a day to help with cough (benzonatate) Take a DM product (dextromethorphan) like Delsym, Mucinex DM, or Robitussin-DM along with the benzonatate Continue to drink plenty of fluids Increase use of your albuterol Consider humidifier in your bedroom Call your doctor if not improving by next week   ED Prescriptions     Medication Sig Dispense Auth. Provider   predniSONE (DELTASONE) 20 MG tablet Take 1 pill 2 times a day for 5 days then take 1 pill 1 time a day for 5 days 15 tablet Eustace Moore, MD   benzonatate (TESSALON) 200 MG capsule Take 1 pill 2-3 times a day for cough. 20 capsule Eustace Moore, MD      PDMP not reviewed this encounter.   Eustace Moore, MD 08/27/20 [phone]

## 2020-08-27 NOTE — Discharge Instructions (Addendum)
Take prednisone as directed.  Take 2 doses today Take Tessalon 2-3 times a day to help with cough (benzonatate) Take a DM product (dextromethorphan) like Delsym, Mucinex DM, or Robitussin-DM along with the benzonatate Continue to drink plenty of fluids Increase use of your albuterol Consider humidifier in your bedroom Call your doctor if not improving by next week

## 2020-08-27 NOTE — ED Triage Notes (Signed)
Finished steroids on Monday Cough has gradually got worse since then Dry nonproductive  cough  Finished tessalon perles Has albuterol inhaler - min relief Denies fever OTC cough drops - tessalon perles

## 2021-07-11 IMAGING — DX DG CERVICAL SPINE COMPLETE 4+V
6 series · 6 of 6 positions shown · non-contrast
Comparison: October 12, 2018 cervical radiograph; cervical MRI
February 21, 2019

CLINICAL DATA: Pain following fall

EXAM:
CERVICAL SPINE - COMPLETE 4+ VIEW

[c-spine lat]
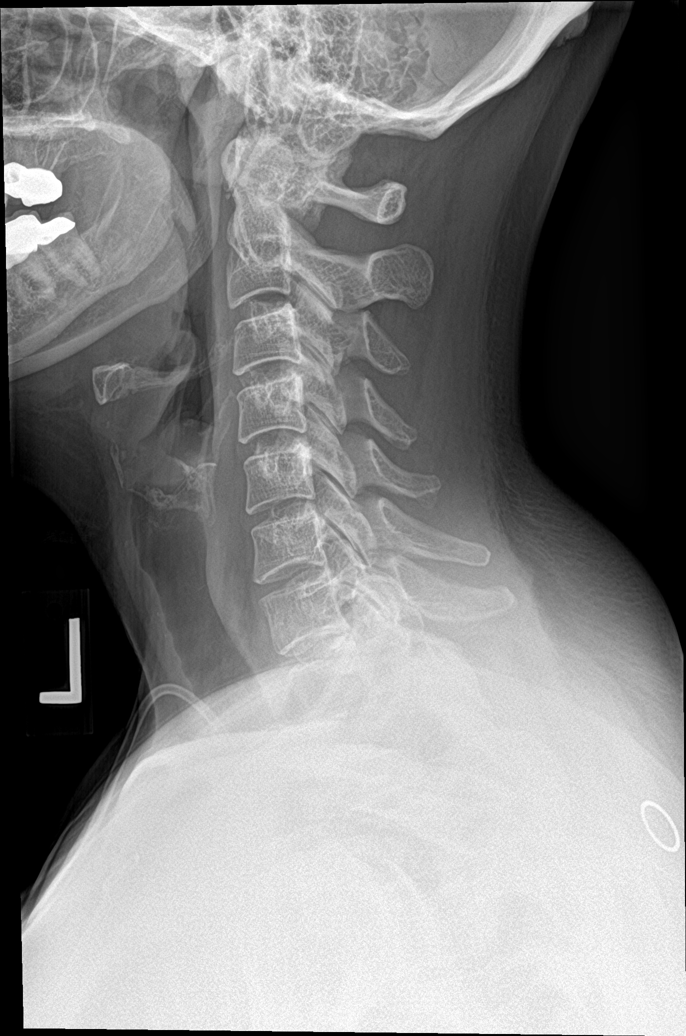

[c-spine obl (1 of 2)]
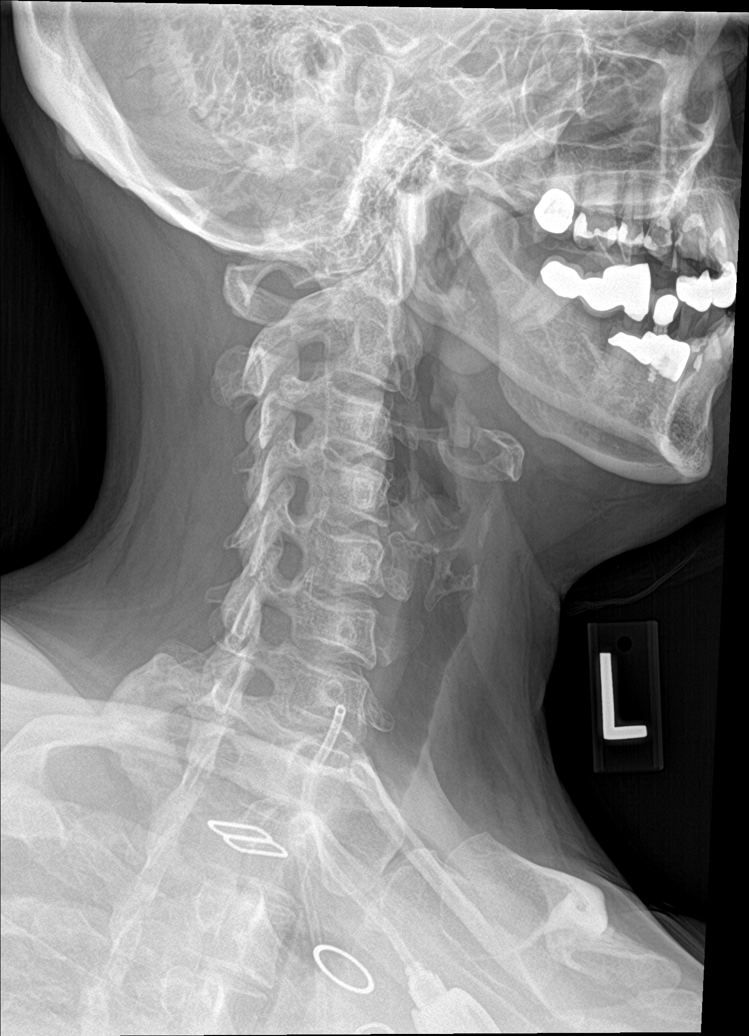

[c-spine obl (2 of 2)]
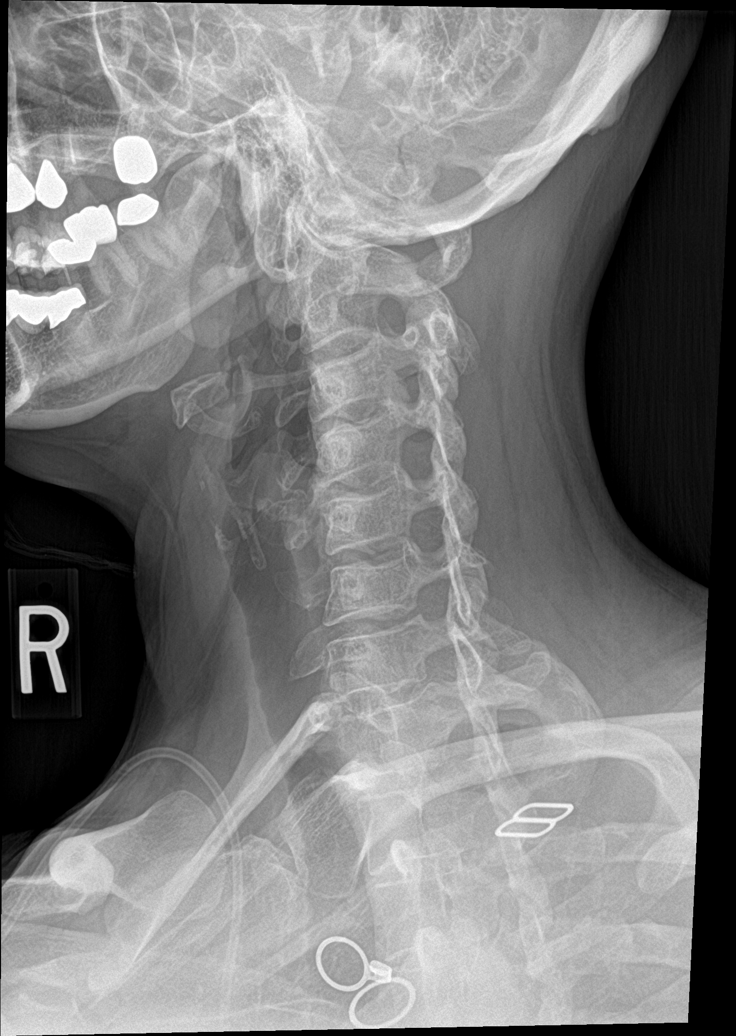

[c-spine ap]
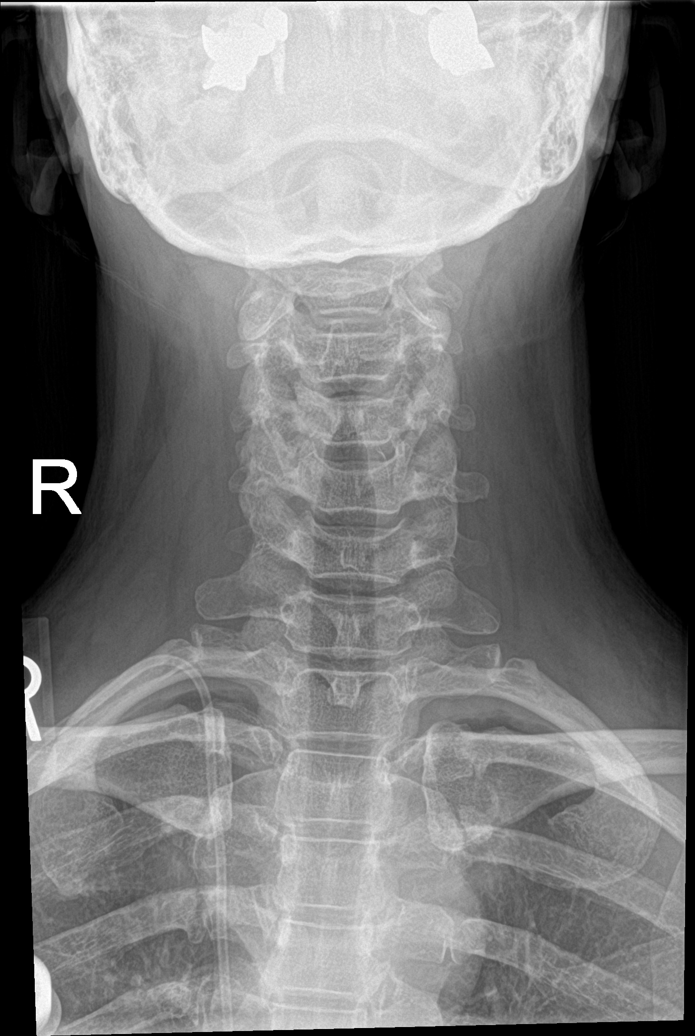

[c-spine open mouth]
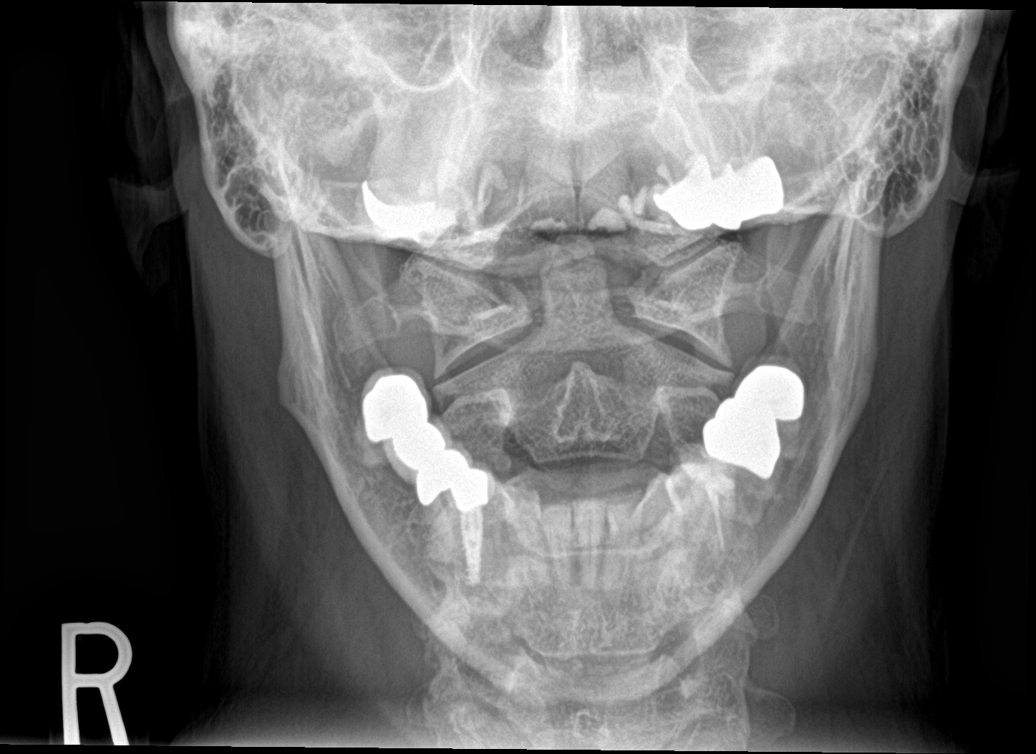

[c-spine swimmers]
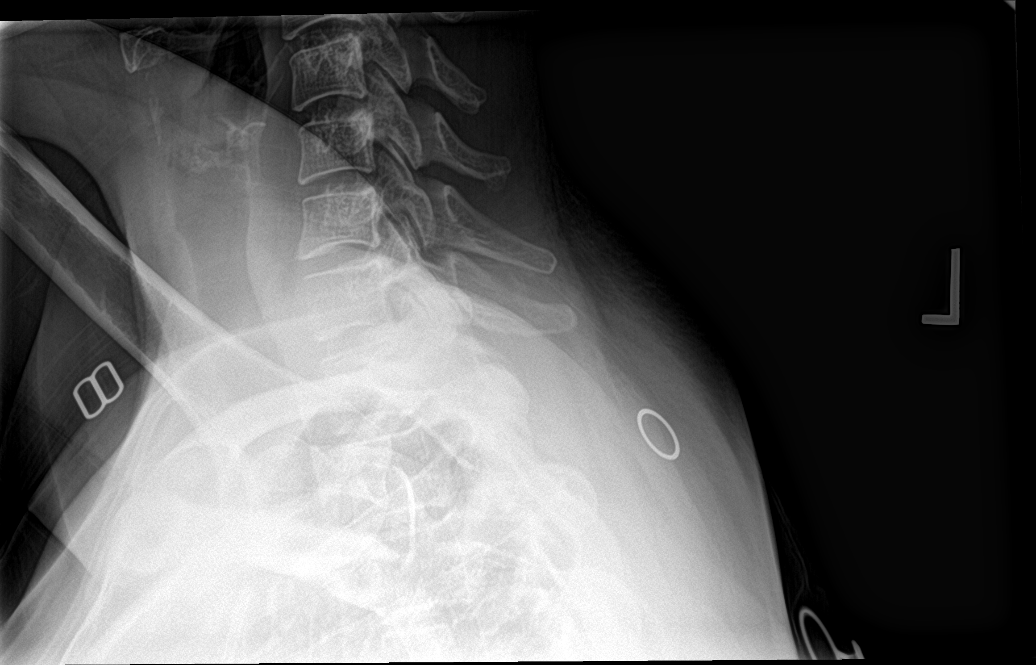

[6 of 6 positions shown; findings below may reference images not displayed]

FINDINGS: Frontal, lateral, open-mouth odontoid, and bilateral oblique views
were obtained. There is no fracture or spondylolisthesis.
Prevertebral soft tissues and predental space regions are normal.
Disc spaces appear unremarkable. There is persistent facet
hypertrophy at C3-4 bilaterally and to a lesser degree at C2-3
bilaterally. No new facet hypertrophy evident. Lung bases are clear.
IMPRESSION: Facet hypertrophy at C3-4 bilaterally and to a lesser degree at C2-3
bilaterally, noted previously. No appreciable disc space narrowing.
No fracture or spondylolisthesis.
